# Patient Record
Sex: Female | Born: 1990 | Race: Black or African American | Hispanic: No | Marital: Single | Smoking: Current some day smoker
Health system: Southern US, Community
[De-identification: ages and names within clinical notes are randomized; demographics above are authoritative.]

---
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
## 2011-01-27 ENCOUNTER — Emergency Department (HOSPITAL_COMMUNITY)
Admission: EM | Admit: 2011-01-27 | Discharge: 2011-01-27 | Disposition: A | Discharge status: Home / Self Care | Payer: Self-pay | Attending: Emergency Medicine | Admitting: Emergency Medicine

## 2011-01-27 DIAGNOSIS — H9209 Otalgia, unspecified ear: Secondary | ICD-10-CM | POA: Insufficient documentation

## 2011-01-27 DIAGNOSIS — H6690 Otitis media, unspecified, unspecified ear: Secondary | ICD-10-CM | POA: Insufficient documentation

## 2011-02-03 ENCOUNTER — Inpatient Hospital Stay (INDEPENDENT_AMBULATORY_CARE_PROVIDER_SITE_OTHER)
Admission: RE | Admit: 2011-02-03 | Discharge: 2011-02-03 | Disposition: A | Discharge status: Home / Self Care | Payer: 59 | Source: Ambulatory Visit | Attending: Family Medicine | Admitting: Family Medicine

## 2011-02-03 DIAGNOSIS — I10 Essential (primary) hypertension: Secondary | ICD-10-CM

## 2011-02-03 DIAGNOSIS — T50995A Adverse effect of other drugs, medicaments and biological substances, initial encounter: Secondary | ICD-10-CM

## 2011-02-03 DIAGNOSIS — H6690 Otitis media, unspecified, unspecified ear: Secondary | ICD-10-CM

## 2011-12-24 ENCOUNTER — Encounter (HOSPITAL_COMMUNITY): Payer: Self-pay | Admitting: *Deleted

## 2011-12-24 DIAGNOSIS — R110 Nausea: Secondary | ICD-10-CM | POA: Insufficient documentation

## 2011-12-24 DIAGNOSIS — R51 Headache: Secondary | ICD-10-CM | POA: Insufficient documentation

## 2011-12-24 DIAGNOSIS — Z880 Allergy status to penicillin: Secondary | ICD-10-CM | POA: Insufficient documentation

## 2011-12-24 DIAGNOSIS — N390 Urinary tract infection, site not specified: Secondary | ICD-10-CM | POA: Insufficient documentation

## 2011-12-24 NOTE — ED Notes (Signed)
The pt has had a headache all day  With nausea.  She has headaches around her lmp usually

## 2011-12-25 ENCOUNTER — Emergency Department (HOSPITAL_COMMUNITY)
Admission: EM | Admit: 2011-12-25 | Discharge: 2011-12-25 | Disposition: A | Discharge status: Home / Self Care | Payer: 59 | Attending: Emergency Medicine | Admitting: Emergency Medicine

## 2011-12-25 DIAGNOSIS — N390 Urinary tract infection, site not specified: Secondary | ICD-10-CM

## 2011-12-25 DIAGNOSIS — R51 Headache: Secondary | ICD-10-CM

## 2011-12-25 LAB — URINALYSIS, ROUTINE W REFLEX MICROSCOPIC
Bilirubin Urine: NEGATIVE
Glucose, UA: NEGATIVE mg/dL
Hgb urine dipstick: NEGATIVE
Ketones, ur: NEGATIVE mg/dL
Nitrite: NEGATIVE
Protein, ur: NEGATIVE mg/dL
Specific Gravity, Urine: 1.029 (ref 1.005–1.030)
pH: 6.5 (ref 5.0–8.0)

## 2011-12-25 LAB — URINE MICROSCOPIC-ADD ON

## 2011-12-25 MED ORDER — NITROFURANTOIN MONOHYD MACRO 100 MG PO CAPS: 100.0000 mg | ORAL_CAPSULE | Freq: Two times a day (BID) | ORAL | Status: AC

## 2011-12-25 MED ORDER — NITROFURANTOIN MONOHYD MACRO 100 MG PO CAPS
100.0000 mg | ORAL_CAPSULE | Freq: Once | ORAL | Status: AC
  Administered 2011-12-25: 100 mg via ORAL
  Filled 2011-12-25: qty 1

## 2011-12-25 MED ORDER — METOCLOPRAMIDE HCL 5 MG/ML IJ SOLN
10.0000 mg | Freq: Once | INTRAMUSCULAR | Status: AC
  Administered 2011-12-25: 10 mg via INTRAVENOUS
  Filled 2011-12-25: qty 2

## 2011-12-25 MED ORDER — KETOROLAC TROMETHAMINE 30 MG/ML IJ SOLN
30.0000 mg | Freq: Once | INTRAMUSCULAR | Status: AC
  Administered 2011-12-25: 30 mg via INTRAMUSCULAR
  Filled 2011-12-25: qty 1

## 2011-12-25 NOTE — ED Notes (Signed)
Patient is resting comfortably. 

## 2011-12-25 NOTE — ED Notes (Signed)
Pt ambulated with a steady gait; VSS; A&Ox3; no signs of distress; respirations even and unlabored; skin warm and dry. No questions  

## 2011-12-25 NOTE — ED Provider Notes (Signed)
History     CSN: [id number]  Arrival date & time 12/24/11  2326   First MD Initiated Contact with Patient 12/25/11 0114      Chief Complaint  Patient presents with  . Headache    (Consider location/radiation/quality/duration/timing/severity/associated sxs/prior treatment) HPI Comments: Pateint with frontal HA and nausea today took 1 baby asa without relief.   States she gets headache around the time of her menses which is due in 2 days   Patient is a 21 y.o. female presenting with headaches. The history is provided by the patient.  Headache  This is a new problem. The problem occurs constantly. The headache is associated with nothing. The pain is located in the frontal region. The pain is at a severity of 5/10. The pain is mild. The pain does not radiate. Associated symptoms include nausea. Pertinent negatives include no fever, no shortness of breath and no vomiting.    History reviewed. No pertinent past medical history.  History reviewed. No pertinent past surgical history.  No family history on file.  History  Substance Use Topics  . Smoking status: Never Smoker   . Smokeless tobacco: Not on file  . Alcohol Use: No    OB History    Grav Para Term Preterm Abortions TAB SAB Ect Mult Living                  Review of Systems  Constitutional: Negative for fever.  Respiratory: Negative for shortness of breath.   Gastrointestinal: Positive for nausea. Negative for vomiting.  Genitourinary: Negative for dysuria, frequency, vaginal bleeding and vaginal discharge.  Neurological: Positive for headaches. Negative for dizziness.    Allergies  Penicillins  Home Medications   Current Outpatient Rx  Name Route Sig Dispense Refill  . ASPIRIN 81 MG PO CHEW Oral Chew 486 mg by mouth daily as needed. For pain    . NITROFURANTOIN MONOHYD MACRO 100 MG PO CAPS Oral Take 1 capsule (100 mg total) by mouth 2 (two) times daily. 10 capsule 0    BP 112/74  Pulse 71  Temp 98.2 F  (36.8 C) (Oral)  Resp 18  SpO2 98%  LMP 11/27/2011  Physical Exam  Constitutional: She appears well-developed.  HENT:  Head: Normocephalic.  Eyes: Pupils are equal, round, and reactive to light.  Neck: Normal range of motion.  Cardiovascular: Normal rate.   Pulmonary/Chest: Effort normal.  Abdominal: Soft. There is no tenderness.  Musculoskeletal: Normal range of motion.  Neurological: She is alert.  Skin: Skin is warm.    ED Course  Procedures (including critical care time)  Labs Reviewed  URINALYSIS, ROUTINE W REFLEX MICROSCOPIC - Abnormal; Notable for the following:    APPearance CLOUDY (*)     Leukocytes, UA SMALL (*)     All other components within normal limits  URINE MICROSCOPIC-ADD ON - Abnormal; Notable for the following:    Squamous Epithelial / LPF MANY (*)     Bacteria, UA MANY (*)     All other components within normal limits  URINE CULTURE   No results found. Pregnancy test negative  1. Headache   2. UTI (lower urinary tract infection)       MDM  Frontal headache and nausea today   Will check UA as this headache is associated with nausea that is unusual for her  UA positive will treat with macrobid for 5 days         Arman Filter, NP 12/25/11 0254  Cipriano Mile  Manus Rudd, NP 12/25/11 1610

## 2011-12-25 NOTE — ED Provider Notes (Signed)
Medical screening examination/treatment/procedure(s) were performed by non-physician practitioner and as supervising physician I was immediately available for consultation/collaboration.   Avaree Gilberti, MD 12/25/11 0600 

## 2011-12-26 LAB — URINE CULTURE

## 2011-12-27 LAB — POCT PREGNANCY, URINE: Preg Test, Ur: NEGATIVE

## 2013-03-12 ENCOUNTER — Encounter (HOSPITAL_COMMUNITY): Payer: Self-pay | Admitting: Emergency Medicine

## 2013-03-12 ENCOUNTER — Emergency Department (INDEPENDENT_AMBULATORY_CARE_PROVIDER_SITE_OTHER)
Admission: EM | Admit: 2013-03-12 | Discharge: 2013-03-12 | Disposition: A | Discharge status: Home / Self Care | Payer: Self-pay | Source: Home / Self Care | Attending: Family Medicine | Admitting: Family Medicine

## 2013-03-12 DIAGNOSIS — J029 Acute pharyngitis, unspecified: Secondary | ICD-10-CM

## 2013-03-12 LAB — POCT RAPID STREP A: Streptococcus, Group A Screen (Direct): NEGATIVE

## 2013-03-12 MED ORDER — AMOXICILLIN 500 MG PO CAPS: 500.0000 mg | ORAL_CAPSULE | Freq: Three times a day (TID) | ORAL | Status: DC

## 2013-03-12 NOTE — ED Provider Notes (Signed)
Sabrina Berry is a 22 y.o. female who presents to Urgent Care today for sore throat and mild subjective fever present since yesterday. Patient has not tried any medications. She notes some positive sick contacts at home. She denies any cough congestion runny nose nausea vomiting or diarrhea. She denies any trouble breathing. She feels well otherwise. She is allergic to penicillin but can take amoxicillin.   History reviewed. No pertinent past medical history. History  Substance Use Topics  . Smoking status: Never Smoker   . Smokeless tobacco: Not on file  . Alcohol Use: No   ROS as above Medications reviewed. No current facility-administered medications for this encounter.   Current Outpatient Prescriptions  Medication Sig Dispense Refill  . amoxicillin (AMOXIL) 500 MG capsule Take 1 capsule (500 mg total) by mouth 3 (three) times daily.  30 capsule  0  . aspirin 81 MG chewable tablet Chew 486 mg by mouth daily as needed. For pain        Exam:  BP 120/87  Pulse 78  Temp(Src) 98.8 F (37.1 C) (Oral)  Resp 18  SpO2 98%  LMP 02/10/2013 Gen: Well NAD HEENT: EOMI,  MMM, posterior pharynx is erythematous with exudate. Tympanic membranes are normal appearing bilaterally Lungs: CTABL Nl WOB Heart: RRR no MRG Abd: NABS, NT, ND Exts: Non edematous BL  LE, warm and well perfused.   Results for orders placed during the hospital encounter of 03/12/13 (from the past 24 hour(s))  POCT RAPID STREP A ([facility] ONLY)     Status: None   Collection Time    03/12/13 10:20 AM      Result Value Range   Streptococcus, Group A Screen (Direct) NEGATIVE  NEGATIVE   No results found.  Assessment and Plan: 22 y.o. female with pharyngitis very likely streptococcal. Point-of-care test negative culture pending. Plan to treat empirically with amoxicillin. Patient states she can tolerate this medication. Loss use over-the-counter pain medications as needed for symptom relief. Discussed warning  signs or symptoms. Please see discharge instructions. Patient expresses understanding.      Rodolph Bong, MD 03/12/13 [phone]

## 2013-03-12 NOTE — ED Notes (Signed)
C/o sore throat. Onset yesterday . Pt has felt feverish and having pain with swallowing. Denies any other symptoms.  otc meds taken with no relief.

## 2013-03-14 LAB — CULTURE, GROUP A STREP

## 2013-03-15 ENCOUNTER — Telehealth (HOSPITAL_COMMUNITY): Payer: Self-pay | Admitting: *Deleted

## 2013-03-15 NOTE — ED Notes (Signed)
Throat culture: Strep beta hemolytic not group A.  Pt. adequately treated with Amoxicillin.  I called pt. and VM not set up. Vassie Moselle 03/15/2013

## 2013-03-16 ENCOUNTER — Telehealth (HOSPITAL_COMMUNITY): Payer: Self-pay | Admitting: *Deleted

## 2013-03-16 NOTE — ED Notes (Signed)
Voicemail not set up.  I called contact-mother and left a message for pt. to call. Sabrina Berry 03/16/2013

## 2013-03-18 ENCOUNTER — Telehealth (HOSPITAL_COMMUNITY): Payer: Self-pay | Admitting: *Deleted

## 2013-03-18 NOTE — ED Notes (Signed)
I called pt. Pt. verified x 2 and given result.  Pt. told she was adequately treated with Amoxicillin for strep. Notify contacts if they get the same symptoms to get checked. Pt. states she is getting better. Sabrina Berry 03/18/2013

## 2013-06-18 ENCOUNTER — Emergency Department (HOSPITAL_COMMUNITY)
Admission: EM | Admit: 2013-06-18 | Discharge: 2013-06-18 | Discharge status: Against Medical Advice | Payer: Self-pay | Attending: Emergency Medicine | Admitting: Emergency Medicine

## 2013-06-18 ENCOUNTER — Encounter (HOSPITAL_COMMUNITY): Payer: Self-pay | Admitting: Emergency Medicine

## 2013-06-18 DIAGNOSIS — R079 Chest pain, unspecified: Secondary | ICD-10-CM | POA: Insufficient documentation

## 2013-06-18 DIAGNOSIS — R110 Nausea: Secondary | ICD-10-CM | POA: Insufficient documentation

## 2013-06-18 NOTE — ED Notes (Signed)
Pt. reports mid chest pain onset last night with nausea , denies SOB / cough or congestion .

## 2013-10-27 ENCOUNTER — Emergency Department (INDEPENDENT_AMBULATORY_CARE_PROVIDER_SITE_OTHER)
Admission: EM | Admit: 2013-10-27 | Discharge: 2013-10-27 | Disposition: A | Discharge status: Home / Self Care | Payer: Self-pay | Source: Home / Self Care | Attending: Family Medicine | Admitting: Family Medicine

## 2013-10-27 DIAGNOSIS — K529 Noninfective gastroenteritis and colitis, unspecified: Secondary | ICD-10-CM

## 2013-10-27 DIAGNOSIS — K5289 Other specified noninfective gastroenteritis and colitis: Secondary | ICD-10-CM

## 2013-10-27 LAB — POCT I-STAT, CHEM 8
BUN: 15 mg/dL (ref 6–23)
CALCIUM ION: 1.22 mmol/L (ref 1.12–1.23)
CREATININE: 0.9 mg/dL (ref 0.50–1.10)
Chloride: 106 mEq/L (ref 96–112)
Glucose, Bld: 125 mg/dL — ABNORMAL HIGH (ref 70–99)
HCT: 45 % (ref 36.0–46.0)
HEMOGLOBIN: 15.3 g/dL — AB (ref 12.0–15.0)
Potassium: 3.5 mEq/L — ABNORMAL LOW (ref 3.7–5.3)
SODIUM: 141 meq/L (ref 137–147)
TCO2: 19 mmol/L (ref 0–100)

## 2013-10-27 MED ORDER — ONDANSETRON HCL 4 MG PO TABS: 4.0000 mg | ORAL_TABLET | Freq: Four times a day (QID) | ORAL | Status: DC

## 2013-10-27 MED ORDER — ONDANSETRON HCL 4 MG/2ML IJ SOLN
4.0000 mg | Freq: Once | INTRAMUSCULAR | Status: AC
  Administered 2013-10-27: 4 mg via INTRAVENOUS

## 2013-10-27 MED ORDER — SODIUM CHLORIDE 0.9 % IV SOLN
Freq: Once | INTRAVENOUS | Status: AC
  Administered 2013-10-27: 16:00:00 via INTRAVENOUS

## 2013-10-27 MED ORDER — ONDANSETRON HCL 4 MG/2ML IJ SOLN
INTRAMUSCULAR | Status: AC
  Filled 2013-10-27: qty 2

## 2013-10-27 NOTE — ED Notes (Signed)
20 gauge iv inserted via left AC without diff 4 mg iv given via line Pt is resting comfortable. Will cot to monitor

## 2013-10-27 NOTE — ED Provider Notes (Signed)
CSN: [id number]     Arrival date & time 10/27/13  1328 History   None    Chief Complaint  Patient presents with  . GI Problem  . Dizziness   (Consider location/radiation/quality/duration/timing/severity/associated sxs/prior Treatment) Patient is a 23 y.o. female presenting with GI illness and dizziness. The history is provided by the patient.  GI Problem This is a new problem. The current episode started 12 to 24 hours ago. The problem has been gradually improving. Pertinent negatives include no chest pain and no abdominal pain.  Dizziness Associated symptoms: diarrhea, nausea and vomiting   Associated symptoms: no blood in stool and no chest pain     No past medical history on file. No past surgical history on file. No family history on file. History  Substance Use Topics  . Smoking status: Never Smoker   . Smokeless tobacco: Not on file  . Alcohol Use: No   OB History   Grav Para Term Preterm Abortions TAB SAB Ect Mult Living                 Review of Systems  Constitutional: Negative.   Cardiovascular: Negative for chest pain.  Gastrointestinal: Positive for nausea, vomiting and diarrhea. Negative for abdominal pain and blood in stool.  Genitourinary: Negative.   Neurological: Positive for dizziness.    Allergies  Penicillins  Home Medications   Prior to Admission medications   Medication Sig Start Date End Date Taking? Authorizing Provider  amoxicillin (AMOXIL) 500 MG capsule Take 1 capsule (500 mg total) by mouth 3 (three) times daily. 03/12/13   Rodolph Bong, MD  aspirin 81 MG chewable tablet Chew 486 mg by mouth daily as needed. For pain    Historical Provider, MD   BP 99/69  Pulse 117 Physical Exam  Nursing note and vitals reviewed. Constitutional: She is oriented to person, place, and time. She appears well-developed and well-nourished.  HENT:  Head: Normocephalic.  Right Ear: External ear normal.  Left Ear: External ear normal.  Mouth/Throat:  Oropharynx is clear and moist.  Eyes: Conjunctivae and EOM are normal. Pupils are equal, round, and reactive to light.  Neck: Normal range of motion. Neck supple.  Cardiovascular: Normal heart sounds.   Pulmonary/Chest: Breath sounds normal.  Abdominal: Soft. Bowel sounds are normal. She exhibits no distension and no mass. There is no tenderness. There is no rebound and no guarding.  Lymphadenopathy:    She has no cervical adenopathy.  Neurological: She is alert and oriented to person, place, and time.  Skin: Skin is warm and dry.    ED Course  Procedures (including critical care time) Labs Review Labs Reviewed  POCT I-STAT, CHEM 8 - Abnormal; Notable for the following:    Potassium 3.5 (*)    Glucose, Bld 125 (*)    Hemoglobin 15.3 (*)    All other components within normal limits    Imaging Review No results found.   MDM   1. Gastroenteritis, acute    Sx improved after ivf and meds.    Linna Hoff, MD 10/27/13 [phone]

## 2013-10-27 NOTE — ED Notes (Signed)
Pt comes in with sudden nv/d. States she ate pizza last night but no new foods Orthostats positive  Denies abdominal pain at this time

## 2013-10-27 NOTE — Discharge Instructions (Signed)
Clear liquid , bland diet tonight as tolerated, advance on sun as improved, use medicine as needed for vomiting and imodium for diarrhea. return or see your doctor if any problems.

## 2014-08-02 ENCOUNTER — Emergency Department (INDEPENDENT_AMBULATORY_CARE_PROVIDER_SITE_OTHER)
Admission: EM | Admit: 2014-08-02 | Discharge: 2014-08-02 | Disposition: A | Discharge status: Home / Self Care | Payer: Self-pay | Source: Home / Self Care | Attending: Emergency Medicine | Admitting: Emergency Medicine

## 2014-08-02 ENCOUNTER — Other Ambulatory Visit (HOSPITAL_COMMUNITY)
Admission: RE | Admit: 2014-08-02 | Discharge: 2014-08-02 | Disposition: A | Discharge status: Home / Self Care | Payer: Self-pay | Source: Ambulatory Visit | Attending: Emergency Medicine | Admitting: Emergency Medicine

## 2014-08-02 ENCOUNTER — Encounter (HOSPITAL_COMMUNITY): Payer: Self-pay | Admitting: Emergency Medicine

## 2014-08-02 DIAGNOSIS — N760 Acute vaginitis: Secondary | ICD-10-CM | POA: Insufficient documentation

## 2014-08-02 DIAGNOSIS — A499 Bacterial infection, unspecified: Secondary | ICD-10-CM

## 2014-08-02 DIAGNOSIS — B9689 Other specified bacterial agents as the cause of diseases classified elsewhere: Secondary | ICD-10-CM

## 2014-08-02 DIAGNOSIS — Z113 Encounter for screening for infections with a predominantly sexual mode of transmission: Secondary | ICD-10-CM | POA: Insufficient documentation

## 2014-08-02 LAB — POCT URINALYSIS DIP (DEVICE)
Bilirubin Urine: NEGATIVE
GLUCOSE, UA: NEGATIVE mg/dL
HGB URINE DIPSTICK: NEGATIVE
Ketones, ur: NEGATIVE mg/dL
Leukocytes, UA: NEGATIVE
NITRITE: NEGATIVE
PROTEIN: NEGATIVE mg/dL
UROBILINOGEN UA: 0.2 mg/dL (ref 0.0–1.0)
pH: 6 (ref 5.0–8.0)

## 2014-08-02 LAB — POCT PREGNANCY, URINE: PREG TEST UR: NEGATIVE

## 2014-08-02 MED ORDER — FLUCONAZOLE 150 MG PO TABS: 150.0000 mg | ORAL_TABLET | Freq: Once | ORAL | Status: DC

## 2014-08-02 MED ORDER — METRONIDAZOLE 500 MG PO TABS: 500.0000 mg | ORAL_TABLET | Freq: Two times a day (BID) | ORAL | Status: DC

## 2014-08-02 NOTE — Discharge Instructions (Signed)
You have bacterial vaginosis and also a yeast infection. Take Flagyl one pill twice a day for one week. You have finished the Flagyl, take the Diflucan pill. I have sent your urine for culture. If you need additional antibiotics we will call you. Follow-up as needed.

## 2014-08-02 NOTE — ED Notes (Signed)
Pt is here today for a possible UTI, pt said that she has had urinary frequency and pain with urination for about 1 week, pt said that she has also had vaginal odor and discharge for the last few days

## 2014-08-02 NOTE — ED Provider Notes (Signed)
CSN: [id number]     Arrival date & time 08/02/14  [id number] History   First MD Initiated Contact with Patient 08/02/14 1027     Chief Complaint  Patient presents with  . Urinary Tract Infection   (Consider location/radiation/quality/duration/timing/severity/associated sxs/prior Treatment) HPI She is a 24 year old woman here for evaluation of dysuria and vaginal discharge. She states for about the last week she has had dysuria and frequency. She also reports some mild abdominal pain first thing in the morning. This will resolve within 30 minutes. She states the dysuria has improved. Over the last 3 days she has noted a vaginal discharge with odor. She denies itching. No new sexual partners. No fevers or chills. No pelvic pain. She would like STD screening today.  History reviewed. No pertinent past medical history. History reviewed. No pertinent past surgical history. History reviewed. No pertinent family history. History  Substance Use Topics  . Smoking status: Never Smoker   . Smokeless tobacco: Not on file  . Alcohol Use: No   OB History    No data available     Review of Systems  Constitutional: Negative for fever and chills.  Gastrointestinal: Positive for abdominal pain. Negative for nausea and vomiting.  Genitourinary: Positive for dysuria, frequency and vaginal discharge. Negative for hematuria and flank pain.    Allergies  Penicillins  Home Medications   Prior to Admission medications   Medication Sig Start Date End Date Taking? Authorizing Provider  levonorgestrel-ethinyl estradiol (AVIANE,ALESSE,LESSINA) 0.1-20 MG-MCG tablet Take 1 tablet by mouth daily.   Yes Historical Provider, MD  amoxicillin (AMOXIL) 500 MG capsule Take 1 capsule (500 mg total) by mouth 3 (three) times daily. 03/12/13   Rodolph BongEvan S Corey, MD  aspirin 81 MG chewable tablet Chew 486 mg by mouth daily as needed. For pain    Historical Provider, MD  fluconazole (DIFLUCAN) 150 MG tablet Take 1 tablet (150 mg  total) by mouth once. 08/02/14   Charm RingsErin J Kimber Fritts, MD  metroNIDAZOLE (FLAGYL) 500 MG tablet Take 1 tablet (500 mg total) by mouth 2 (two) times daily. 08/02/14   Charm RingsErin J Hoover Grewe, MD  ondansetron (ZOFRAN) 4 MG tablet Take 1 tablet (4 mg total) by mouth every 6 (six) hours. Prn n/v. 10/27/13   Linna HoffJames D Kindl, MD   BP 116/76 mmHg  Pulse 69  Temp(Src) 98.4 F (36.9 C) (Oral)  Resp 16  SpO2 99%  LMP 07/14/2014 Physical Exam  Constitutional: She is oriented to person, place, and time. She appears well-developed and well-nourished. No distress.  Neck: Neck supple.  Cardiovascular: Normal rate.   Pulmonary/Chest: Effort normal.  Abdominal: Soft.  Genitourinary: There is no rash on the right labia. There is no rash on the left labia. Cervix exhibits no discharge. Vaginal discharge (white, odor) found.  Neurological: She is alert and oriented to person, place, and time.    ED Course  Procedures (including critical care time) Labs Review Labs Reviewed  URINE CULTURE  HIV ANTIBODY (ROUTINE TESTING)  RPR  POCT URINALYSIS DIP (DEVICE)  POCT PREGNANCY, URINE  CERVICOVAGINAL ANCILLARY ONLY    Imaging Review No results found.   MDM   1. BV (bacterial vaginosis)    UA is clean.  Urine sent for culture. Will treat with Flagyl. Diflucan after flagyl. STD screening labs collected. Follow up as needed.    Charm RingsErin J Sven Pinheiro, MD 08/02/14 [phone]

## 2014-08-03 LAB — HIV ANTIBODY (ROUTINE TESTING W REFLEX): HIV Screen 4th Generation wRfx: NONREACTIVE

## 2014-08-03 LAB — URINE CULTURE

## 2014-08-03 LAB — RPR: RPR Ser Ql: NONREACTIVE

## 2014-08-05 LAB — CERVICOVAGINAL ANCILLARY ONLY
CHLAMYDIA, DNA PROBE: NEGATIVE
NEISSERIA GONORRHEA: NEGATIVE
WET PREP (BD AFFIRM): NEGATIVE
Wet Prep (BD Affirm): NEGATIVE
Wet Prep (BD Affirm): POSITIVE — AB

## 2014-08-07 NOTE — ED Notes (Signed)
GC/Chlamydia neg., Affirm: Candida and Trich neg., Gardnerella pos.  Pt. adequately treated with Flagyl. Sabrina Berry, Sabrina Berry 08/07/2014

## 2014-09-27 ENCOUNTER — Emergency Department (HOSPITAL_COMMUNITY): Payer: Self-pay

## 2014-09-27 ENCOUNTER — Encounter (HOSPITAL_COMMUNITY): Payer: Self-pay | Admitting: Oncology

## 2014-09-27 ENCOUNTER — Emergency Department (HOSPITAL_COMMUNITY)
Admission: EM | Admit: 2014-09-27 | Discharge: 2014-09-27 | Disposition: A | Discharge status: Home / Self Care | Payer: Self-pay | Attending: Emergency Medicine | Admitting: Emergency Medicine

## 2014-09-27 DIAGNOSIS — Y92410 Unspecified street and highway as the place of occurrence of the external cause: Secondary | ICD-10-CM | POA: Insufficient documentation

## 2014-09-27 DIAGNOSIS — Z880 Allergy status to penicillin: Secondary | ICD-10-CM | POA: Insufficient documentation

## 2014-09-27 DIAGNOSIS — S51811A Laceration without foreign body of right forearm, initial encounter: Secondary | ICD-10-CM | POA: Insufficient documentation

## 2014-09-27 DIAGNOSIS — S41111A Laceration without foreign body of right upper arm, initial encounter: Secondary | ICD-10-CM

## 2014-09-27 DIAGNOSIS — Z793 Long term (current) use of hormonal contraceptives: Secondary | ICD-10-CM | POA: Insufficient documentation

## 2014-09-27 DIAGNOSIS — Y998 Other external cause status: Secondary | ICD-10-CM | POA: Insufficient documentation

## 2014-09-27 DIAGNOSIS — Z79899 Other long term (current) drug therapy: Secondary | ICD-10-CM | POA: Insufficient documentation

## 2014-09-27 DIAGNOSIS — Z7982 Long term (current) use of aspirin: Secondary | ICD-10-CM | POA: Insufficient documentation

## 2014-09-27 DIAGNOSIS — Y9389 Activity, other specified: Secondary | ICD-10-CM | POA: Insufficient documentation

## 2014-09-27 MED ORDER — OXYCODONE-ACETAMINOPHEN 5-325 MG PO TABS: 1.0000 | ORAL_TABLET | ORAL | Status: DC | PRN

## 2014-09-27 MED ORDER — OXYCODONE-ACETAMINOPHEN 5-325 MG PO TABS
1.0000 | ORAL_TABLET | Freq: Once | ORAL | Status: AC
  Administered 2014-09-27: 1 via ORAL
  Filled 2014-09-27: qty 1

## 2014-09-27 MED ORDER — LIDOCAINE-EPINEPHRINE 2 %-1:100000 IJ SOLN
20.0000 mL | Freq: Once | INTRAMUSCULAR | Status: AC
  Administered 2014-09-27: 20 mL via INTRADERMAL
  Filled 2014-09-27: qty 1
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
## 2014-09-27 NOTE — Discharge Instructions (Signed)
Take the prescribed medication as directed.  May also take motrin if needed. Follow-up with urgent care in 1 week for suture removal.  Keep sutures clean and dry until then. Return to the ED for new or worsening symptoms.

## 2014-10-07 ENCOUNTER — Encounter (HOSPITAL_COMMUNITY): Payer: Self-pay

## 2014-10-07 ENCOUNTER — Emergency Department (INDEPENDENT_AMBULATORY_CARE_PROVIDER_SITE_OTHER)
Admission: EM | Admit: 2014-10-07 | Discharge: 2014-10-07 | Disposition: A | Discharge status: Home / Self Care | Payer: Self-pay | Source: Home / Self Care | Attending: Family Medicine | Admitting: Family Medicine

## 2014-10-07 DIAGNOSIS — Z4802 Encounter for removal of sutures: Secondary | ICD-10-CM
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
## 2014-10-07 NOTE — Discharge Instructions (Signed)

## 2015-08-01 ENCOUNTER — Encounter (HOSPITAL_COMMUNITY): Payer: Self-pay | Admitting: Emergency Medicine

## 2015-08-01 ENCOUNTER — Emergency Department (HOSPITAL_COMMUNITY): Payer: Self-pay

## 2015-08-01 ENCOUNTER — Emergency Department (HOSPITAL_COMMUNITY)
Admission: EM | Admit: 2015-08-01 | Discharge: 2015-08-01 | Disposition: A | Discharge status: Home / Self Care | Payer: Self-pay | Attending: Emergency Medicine | Admitting: Emergency Medicine

## 2015-08-01 DIAGNOSIS — M436 Torticollis: Secondary | ICD-10-CM | POA: Insufficient documentation

## 2015-08-01 DIAGNOSIS — Y998 Other external cause status: Secondary | ICD-10-CM | POA: Insufficient documentation

## 2015-08-01 DIAGNOSIS — S0990XA Unspecified injury of head, initial encounter: Secondary | ICD-10-CM | POA: Insufficient documentation

## 2015-08-01 DIAGNOSIS — S4991XA Unspecified injury of right shoulder and upper arm, initial encounter: Secondary | ICD-10-CM | POA: Insufficient documentation

## 2015-08-01 DIAGNOSIS — Y9289 Other specified places as the place of occurrence of the external cause: Secondary | ICD-10-CM | POA: Insufficient documentation

## 2015-08-01 DIAGNOSIS — Z793 Long term (current) use of hormonal contraceptives: Secondary | ICD-10-CM | POA: Insufficient documentation

## 2015-08-01 DIAGNOSIS — Z880 Allergy status to penicillin: Secondary | ICD-10-CM | POA: Insufficient documentation

## 2015-08-01 DIAGNOSIS — M542 Cervicalgia: Secondary | ICD-10-CM

## 2015-08-01 DIAGNOSIS — X58XXXA Exposure to other specified factors, initial encounter: Secondary | ICD-10-CM | POA: Insufficient documentation

## 2015-08-01 DIAGNOSIS — Y9389 Activity, other specified: Secondary | ICD-10-CM | POA: Insufficient documentation

## 2015-08-01 MED ORDER — DIAZEPAM 5 MG PO TABS: 5.0000 mg | ORAL_TABLET | Freq: Four times a day (QID) | ORAL | Status: DC | PRN

## 2015-08-01 MED ORDER — ACETAMINOPHEN 325 MG PO TABS
650.0000 mg | ORAL_TABLET | Freq: Once | ORAL | Status: AC
  Administered 2015-08-01: 650 mg via ORAL
  Filled 2015-08-01: qty 2

## 2015-08-01 MED ORDER — ACETAMINOPHEN 500 MG PO TABS: 500.0000 mg | ORAL_TABLET | Freq: Four times a day (QID) | ORAL | Status: AC | PRN

## 2015-08-01 MED ORDER — IBUPROFEN 400 MG PO TABS
800.0000 mg | ORAL_TABLET | Freq: Once | ORAL | Status: AC
  Administered 2015-08-01: 800 mg via ORAL
  Filled 2015-08-01: qty 2

## 2015-08-01 MED ORDER — IBUPROFEN 800 MG PO TABS: 800.0000 mg | ORAL_TABLET | Freq: Three times a day (TID) | ORAL | Status: DC

## 2015-08-01 NOTE — ED Provider Notes (Signed)
CSN: [id number]     Arrival date & time 08/01/15  1102 History  By signing my name below, I, Essence Howell, attest that this documentation has been prepared under the direction and in the presence of Cheri Fowler, PA-C Electronically Signed: Charline Bills, ED Scribe 08/01/2015 at 12:35 PM.   Chief Complaint  Patient presents with  . Neck Pain   The history is provided by the patient. No language interpreter was used.   HPI Comments: Sabrina Berry is a 25 y.o. female who presents to the Emergency Department complaining of sudden onset of constant right-sided neck pain onset this morning. Pt states that she was putting a scarf on her head this morning when she felt a "pop" in her neck followed by sudden, shooting neck pain. Pt reports gradually improving, 8/10 neck pain that is exacerbated with turning her neck and associated occipital HA only with moving her head towards her chin. She denies fever, numbness/tingling in upper extremities, visual disturbance, N/V. No medications tried PTA.  No injury/trauma.   History reviewed. No pertinent past medical history. History reviewed. No pertinent past surgical history. History reviewed. No pertinent family history. Social History  Substance Use Topics  . Smoking status: Never Smoker   . Smokeless tobacco: None  . Alcohol Use: Yes   OB History    No data available     Review of Systems  Musculoskeletal: Positive for neck pain.  Neurological: Positive for headaches.  All other systems reviewed and are negative.  Allergies  Penicillins  Home Medications   Prior to Admission medications   Medication Sig Start Date End Date Taking? Authorizing Provider  acetaminophen (TYLENOL) 500 MG tablet Take 1 tablet (500 mg total) by mouth every 6 (six) hours as needed. 08/01/15   Cheri Fowler, PA-C  diazepam (VALIUM) 5 MG tablet Take 1 tablet (5 mg total) by mouth every 6 (six) hours as needed for muscle spasms. 08/01/15   Cheri Fowler, PA-C  ibuprofen  (ADVIL,MOTRIN) 800 MG tablet Take 1 tablet (800 mg total) by mouth 3 (three) times daily. 08/01/15   Cheri Fowler, PA-C  levonorgestrel-ethinyl estradiol (AVIANE,ALESSE,LESSINA) 0.1-20 MG-MCG tablet Take 1 tablet by mouth daily.    Historical Provider, MD   BP 118/78 mmHg  Pulse 78  Temp(Src) 98.6 F (37 C) (Oral)  Resp 18  SpO2 100% Physical Exam  Constitutional: She is oriented to person, place, and time. She appears well-developed and well-nourished.  Non-toxic appearance. She does not have a sickly appearance. She does not appear ill.  HENT:  Head: Normocephalic and atraumatic.  Right Ear: External ear normal.  Left Ear: External ear normal.  Mouth/Throat: Oropharynx is clear and moist.  Eyes: Conjunctivae are normal. Pupils are equal, round, and reactive to light. No scleral icterus.  Neck: Normal range of motion. Neck supple. No tracheal deviation present.  Patient seated with head crooked to the left.  Decreased ROM towards the right and left (R>L).  Right trapezius TTP with spasm.  No erythema, ecchymosis, or warmth.   Cardiovascular: Normal rate, regular rhythm and normal heart sounds.   No murmur heard. Pulses:      Radial pulses are 2+ on the right side, and 2+ on the left side.  Pulmonary/Chest: Effort normal and breath sounds normal. No accessory muscle usage or stridor. No respiratory distress. She has no wheezes. She has no rhonchi. She has no rales.  Abdominal: Soft. Bowel sounds are normal. She exhibits no distension. There is no tenderness.  Musculoskeletal:  Normal range of motion. She exhibits tenderness.  Lymphadenopathy:    She has no cervical adenopathy.  Neurological: She is alert and oriented to person, place, and time.  Speech clear without dysarthria.  Cranial nerves grossly intact.  Strength and sensation intact bilaterally throughout upper and lower extremities.  No pronator drift.  RAMs intact bilaterally.   Skin: Skin is warm and dry.  Psychiatric: She has a  normal mood and affect. Her behavior is normal.   ED Course  Procedures (including critical care time) DIAGNOSTIC STUDIES: Oxygen Saturation is 100% on RA, normal by my interpretation.    COORDINATION OF CARE: 11:40 AM-Discussed treatment plan which includes Tylenol, ibuprofen and XR with pt at bedside and pt agreed to plan.   Labs Review Labs Reviewed - No data to display  Imaging Review Dg Cervical Spine Complete  08/01/2015  CLINICAL DATA:  Pt was putting on her head scarf this morning and heard her neck crack. She now has all over neck pain. She was leaning her head to the left at the time, and now is it painful to straighten out her neck. No hx of injury or surgery EXAM: CERVICAL SPINE - COMPLETE 4+ VIEW COMPARISON:  None. FINDINGS: There is no evidence of cervical spine fracture or prevertebral soft tissue swelling. Alignment is normal. No other significant bone abnormalities are identified. IMPRESSION: Negative cervical spine radiographs. Electronically Signed   By: Corlis Leak M.D.   On: 08/01/2015 12:32   I have personally reviewed and evaluated these images and lab results as part of my medical decision-making.   EKG Interpretation None      MDM   Final diagnoses:  Neck pain  Torticollis    Patient presents with neck pain likely secondary to muscle spasm.  VSS, NAD.  No neurological deficits.  Doubt carotid artery dissection or SAH.  NVI.  Patient given ibuprofen and tylenol in ED.  Plain films negative.  Plan to discharge home with valium and ibuprofen. Follow up PCP.  Discussed return precautions.  Patient agrees and acknowledges the above plan for discharge.  I personally performed the services described in this documentation, which was scribed in my presence. The recorded information has been reviewed and is accurate.    Cheri Fowler, PA-C 08/01/15 1250  Pricilla Loveless, MD 08/02/15 1101

## 2015-08-01 NOTE — ED Notes (Signed)
Pt was putting on her scarf this am and had sudden neck pain with 'pop'. Pain right neck.

## 2015-08-01 NOTE — ED Notes (Signed)
Pt sts right sided neck pain after "neck popped" this am with movement; pt sts pain into head

## 2015-08-01 NOTE — Discharge Instructions (Signed)
Acute Torticollis °Torticollis is a condition in which the muscles of the neck tighten (contract) abnormally, causing the neck to twist and the head to move into an unnatural position. Torticollis that develops suddenly is called acute torticollis. If torticollis becomes chronic and is left untreated, the face and neck can become deformed. °CAUSES °This condition may be caused by: °· Sleeping in an awkward position (common). °· Extending or twisting the neck muscles beyond their normal position. °· Infection. °In some cases, the cause may not be known. °SYMPTOMS °Symptoms of this condition include: °· An unnatural position of the head. °· Neck pain. °· A limited ability to move the neck. °· Twisting of the neck to one side. °DIAGNOSIS °This condition is diagnosed with a physical exam. You may also have imaging tests, such as an X-ray, CT scan, or MRI. °TREATMENT °Treatment for this condition involves trying to relax the neck muscles. It may include: °· Medicines or shots. °· Physical therapy. °· Surgery. This may be done in severe cases. °HOME CARE INSTRUCTIONS °· Take medicines only as directed by your health care provider. °· Do stretching exercises and massage your neck as directed by your health care provider. °· Keep all follow-up visits as directed by your health care provider. This is important. °SEEK MEDICAL CARE IF: °· You develop a fever. °SEEK IMMEDIATE MEDICAL CARE IF: °· You develop difficulty breathing. °· You develop noisy breathing (stridor). °· You start drooling. °· You have trouble swallowing or have pain with swallowing. °· You develop numbness or weakness in your hands or feet. °· You have changes in your speech, understanding, or vision. °· Your pain gets worse. °  °This information is not intended to replace advice given to you by your health care provider. Make sure you discuss any questions you have with your health care provider. °  °Document Released: 05/21/2000 Document Revised:  10/08/2014 Document Reviewed: 05/20/2014 °Elsevier Interactive Patient Education ©2016 [employer] ° °

## 2018-03-23 ENCOUNTER — Encounter (HOSPITAL_COMMUNITY): Payer: Self-pay | Admitting: Emergency Medicine

## 2018-03-23 ENCOUNTER — Emergency Department (HOSPITAL_COMMUNITY)
Admission: EM | Admit: 2018-03-23 | Discharge: 2018-03-24 | Disposition: A | Discharge status: Home / Self Care | Payer: BLUE CROSS/BLUE SHIELD | Attending: Emergency Medicine | Admitting: Emergency Medicine

## 2018-03-23 ENCOUNTER — Other Ambulatory Visit: Payer: Self-pay

## 2018-03-23 DIAGNOSIS — Z79899 Other long term (current) drug therapy: Secondary | ICD-10-CM | POA: Diagnosis not present

## 2018-03-23 DIAGNOSIS — R102 Pelvic and perineal pain: Secondary | ICD-10-CM | POA: Diagnosis present

## 2018-03-23 LAB — URINALYSIS, ROUTINE W REFLEX MICROSCOPIC
BILIRUBIN URINE: NEGATIVE
Glucose, UA: NEGATIVE mg/dL
Hgb urine dipstick: NEGATIVE
Ketones, ur: NEGATIVE mg/dL
LEUKOCYTES UA: NEGATIVE
Nitrite: NEGATIVE
PH: 5 (ref 5.0–8.0)
Protein, ur: NEGATIVE mg/dL
SPECIFIC GRAVITY, URINE: 1.028 (ref 1.005–1.030)

## 2018-03-23 LAB — WET PREP, GENITAL
Clue Cells Wet Prep HPF POC: NONE SEEN
SPERM: NONE SEEN
Trich, Wet Prep: NONE SEEN
YEAST WET PREP: NONE SEEN

## 2018-03-23 LAB — PREGNANCY, URINE: PREG TEST UR: NEGATIVE

## 2018-03-23 NOTE — ED Provider Notes (Signed)
[facility] EMERGENCY DEPARTMENT Provider Note   CSN: [id number] Arrival date & time: 03/23/18  1924     History   Chief Complaint Chief Complaint  Patient presents with  . Pelvic Pain    HPI Sabrina Berry is a 27 y.o. female with no pertinent past medical history who presents to the emergency department with a chief complaint of pelvic pain.  The patient endorses intermittent pelvic pain x2 days.  She reports that she began to have some right-sided low back pain earlier today.  She characterizes the pain as feeling like a menstrual cramp, but more intense.  She reports associated nausea and white vaginal discharge that did not seem to have an odor.  No history of similar.  No known aggravating or alleviating factors.  No treatment prior to arrival.  She denies fever, chills, dysuria, urinary frequency, urgency, constipation, diarrhea, vomiting, vaginal itching, pain, or bleeding.  She reports that she was previously taking birth control, but is not actively using any form of contraceptives.  She is sexually active with one female partner.  She thinks that she might be pregnant.  LMP was 9/14-21/19, which was several days longer than her usual periods.  She states that she typically has regular menstrual cycles and is currently 4 days late.  The history is provided by the patient. No language interpreter was used.    History reviewed. No pertinent past medical history.  There are no active problems to display for this patient.   History reviewed. No pertinent surgical history.   OB History   None      Home Medications    Prior to Admission medications   Medication Sig Start Date End Date Taking? Authorizing Provider  acetaminophen (TYLENOL) 500 MG tablet Take 1 tablet (500 mg total) by mouth every 6 (six) hours as needed. 08/01/15   Cheri Fowler, PA-C  diazepam (VALIUM) 5 MG tablet Take 1 tablet (5 mg total) by mouth every 6 (six) hours as needed for  muscle spasms. 08/01/15   Cheri Fowler, PA-C  ibuprofen (ADVIL,MOTRIN) 600 MG tablet Take 1 tablet (600 mg total) by mouth every 6 (six) hours as needed. 03/24/18   McDonald, Mia A, PA-C  ondansetron (ZOFRAN ODT) 4 MG disintegrating tablet Take 1 tablet (4 mg total) by mouth every 8 (eight) hours as needed for nausea or vomiting. 03/24/18   McDonald, Mia A, PA-C    Family History No family history on file.  Social History Social History   Tobacco Use  . Smoking status: Never Smoker  . Smokeless tobacco: Never Used  Substance Use Topics  . Alcohol use: Yes    Comment: occ  . Drug use: No     Allergies   Penicillins   Review of Systems Review of Systems  Constitutional: Negative for activity change, chills and fever.  Respiratory: Negative for shortness of breath.   Cardiovascular: Negative for chest pain.  Gastrointestinal: Positive for nausea. Negative for abdominal pain and vomiting.  Genitourinary: Positive for pelvic pain and vaginal discharge. Negative for dysuria, flank pain, hematuria, urgency, vaginal bleeding and vaginal pain.  Musculoskeletal: Positive for back pain.  Skin: Negative for rash.  Allergic/Immunologic: Negative for immunocompromised state.  Neurological: Negative for headaches.  Psychiatric/Behavioral: Negative for confusion.     Physical Exam Updated Vital Signs BP 120/83   Pulse 87   Temp 98.7 F (37.1 C) (Oral)   Resp 16   Ht 5\' 2"  (1.575 m)   Wt 89.8  kg   LMP 02/25/2018   SpO2 97%   BMI 36.21 kg/m   Physical Exam  Constitutional: No distress.  HENT:  Head: Normocephalic.  Eyes: Conjunctivae are normal.  Neck: Neck supple.  Cardiovascular: Normal rate, regular rhythm, normal heart sounds and intact distal pulses. Exam reveals no gallop and no friction rub.  No murmur heard. Pulmonary/Chest: Effort normal and breath sounds normal. No stridor. No respiratory distress. She has no wheezes. She has no rales. She exhibits no tenderness.    Abdominal: Soft. She exhibits no distension and no mass. There is tenderness. There is no rebound and no guarding. No hernia.  Genitourinary: Uterus normal. There is no rash, tenderness, lesion or injury on the right labia. There is no rash, tenderness, lesion or injury on the left labia. Cervix exhibits no motion tenderness and no discharge. Right adnexum displays tenderness. Right adnexum displays no mass and no fullness. Left adnexum displays no mass, no tenderness and no fullness. Vaginal discharge found.  Genitourinary Comments: Chaperoned exam.  Thick, white vaginal discharge noted in the vaginal vault.  No odor is noted.  No friable cervix.  No cervical motion tenderness.  Musculoskeletal: She exhibits no tenderness.  Neurological: She is alert.  Skin: Skin is warm. No rash noted.  Psychiatric: Her behavior is normal.  Nursing note and vitals reviewed.  ED Treatments / Results  Labs (all labs ordered are listed, but only abnormal results are displayed) Labs Reviewed  WET PREP, GENITAL - Abnormal; Notable for the following components:      Result Value   WBC, Wet Prep HPF POC MODERATE (*)    All other components within normal limits  URINALYSIS, ROUTINE W REFLEX MICROSCOPIC  PREGNANCY, URINE  RPR  HIV ANTIBODY (ROUTINE TESTING W REFLEX)  GC/CHLAMYDIA PROBE AMP () NOT AT [facility]    EKG None  Radiology No results found.  Procedures Procedures (including critical care time)  Medications Ordered in ED Medications - No data to display   Initial Impression / Assessment and Plan / ED Course  I have reviewed the triage vital signs and the nursing notes.  Pertinent labs & imaging results that were available during my care of the patient were reviewed by me and considered in my medical decision making (see chart for details).     27 year old female with no pertinent past medical history presenting with pelvic pain for 2 days with right-sided low back pain that began  today.  She has associated nausea and white vaginal discharge.  She is 4 days late for her menstrual cycles, which are typically regular.  She is sexually active and is not currently using any form of contraceptive with her one female sexual partner.  Will order UA, urine pregnancy, wet prep, GC chlamydia.  Patient is also requesting RPR and HIV testing.  Urinalysis is unremarkable.  Wet prep with moderate WBCs, but otherwise unremarkable.  Pregnancy test is negative.  Gonorrhea, chlamydia, RPR, and HIV test are pending.  Discussed results and pending exams with the patient and her significant other.  I suspect the patient's symptoms may be due to anovulation given the change in her normally very consistent menstrual cycles.  Shared decision making conversation where we discussed getting a pelvic ultrasound to further assess for ovarian cyst.  I told her had low suspicion for ovarian torsion given her history and physical exam.  The patient preferred to have a referral to OB/GYN and follow-up in the clinic.  I was also agreeable to  this plan.  Given her exam, I have a low suspicion that her gonorrhea and chlamydia will be positive.  Will defer treatment at this time, which the patient is agreeable to.  Doubt ectopic pregnancy, diverticulitis, appendicitis, cholecystitis, PID, nephrolithiasis, or pyelonephritis.  Strict return precautions given.  She is hemodynamically stable and in no acute distress.  She is safe for discharge home with outpatient follow-up at this time.  Final Clinical Impressions(s) / ED Diagnoses   Final diagnoses:  Pelvic pain in female    ED Discharge Orders         Ordered    ondansetron (ZOFRAN ODT) 4 MG disintegrating tablet  Every 8 hours PRN     03/24/18 0038    ibuprofen (ADVIL,MOTRIN) 600 MG tablet  Every 6 hours PRN     03/24/18 0040           Barkley Boards, PA-C 03/24/18 0556    Tegeler, Canary Brim, MD 03/24/18 1112

## 2018-03-23 NOTE — ED Notes (Signed)
Urine changed to add on to previous collection.  POC not done in triage but sample sent to main lab.  Rec printed and sent

## 2018-03-23 NOTE — ED Triage Notes (Signed)
Pt reports intermittent 6/10 pelvic pain and cramping that started two days ago. Pt reports lower back, right flank pain that started today. Pt denies vaginal bleeding. Reports white vaginal discharge. Denies any urinary sx.

## 2018-03-24 LAB — HIV ANTIBODY (ROUTINE TESTING W REFLEX): HIV Screen 4th Generation wRfx: NONREACTIVE

## 2018-03-24 LAB — GC/CHLAMYDIA PROBE AMP (~~LOC~~) NOT AT ARMC
Chlamydia: NEGATIVE
Neisseria Gonorrhea: NEGATIVE

## 2018-03-24 LAB — RPR: RPR Ser Ql: NONREACTIVE

## 2018-03-24 MED ORDER — ONDANSETRON 4 MG PO TBDP: 4.0000 mg | ORAL_TABLET | Freq: Three times a day (TID) | ORAL | 0 refills | Status: DC | PRN

## 2018-03-24 MED ORDER — IBUPROFEN 600 MG PO TABS: 600.0000 mg | ORAL_TABLET | Freq: Four times a day (QID) | ORAL | 0 refills | Status: AC | PRN

## 2018-03-24 NOTE — Discharge Instructions (Signed)
Thank you for allowing me to care for you today in the Emergency Department.   Take 600 mg of ibuprofen with food every 6 hours to treat your pain.  For nausea, you can let 1 tablet of Zofran dissolve under your tongue every 8 hours as needed.  If your pain does not start to improve with this regimen in the next 5 to 7 days, please call and get established with an OB/GYN for follow-up.  I have listed 1 of the offices above.  Return to the emergency department if you develop new or worsening symptoms including persistent vomiting, severe vaginal bleeding with dizziness, lightheadedness, severe abdominal pain that does not improve with ibuprofen, high fever, or other new, concerning symptoms.

## 2020-08-12 ENCOUNTER — Ambulatory Visit (HOSPITAL_COMMUNITY)
Admission: EM | Admit: 2020-08-12 | Discharge: 2020-08-12 | Disposition: A | Discharge status: Home / Self Care | Payer: BLUE CROSS/BLUE SHIELD | Attending: Family Medicine | Admitting: Family Medicine

## 2020-08-12 ENCOUNTER — Encounter (HOSPITAL_COMMUNITY): Payer: Self-pay | Admitting: Emergency Medicine

## 2020-08-12 ENCOUNTER — Other Ambulatory Visit: Payer: Self-pay

## 2020-08-12 DIAGNOSIS — J029 Acute pharyngitis, unspecified: Secondary | ICD-10-CM | POA: Insufficient documentation

## 2020-08-12 LAB — POCT RAPID STREP A, ED / UC: Streptococcus, Group A Screen (Direct): NEGATIVE

## 2020-08-12 MED ORDER — AZITHROMYCIN 250 MG PO TABS: 250.0000 mg | ORAL_TABLET | Freq: Every day | ORAL | 0 refills | Status: DC

## 2020-08-12 NOTE — ED Triage Notes (Signed)
Patient c/o sore throat that started this morning.   Patient denies fever at home.   Patient endorses difficulty swallowing.   Patient endorses a headache upon onset of symptoms.   Patient denies nasal congestion, cough, or sneezing.   Patient took Excedrin for symptoms with relief of headache.

## 2020-08-12 NOTE — ED Provider Notes (Signed)
°  [facility] CARE CENTER   [id number] 08/12/20 Arrival Time: 1635  ASSESSMENT & PLAN:  1. Sore throat     No signs of peritonsillar abscess. Rapid strep negative. Given exam will tx empirically for strep.  Begin: Meds ordered this encounter  Medications   azithromycin (ZITHROMAX) 250 MG tablet    Sig: Take 1 tablet (250 mg total) by mouth daily. Take first 2 tablets together, then 1 every day until finished.    Dispense:  6 tablet    Refill:  0    Labs Reviewed  CULTURE, GROUP A STREP [facility])  POCT RAPID STREP A, ED / UC    OTC analgesics and throat care as needed  Instructed to finish full course of antibiotics. Will follow up if not showing significant improvement over the next 24-48 hours.    Discharge Instructions      You may use over the counter ibuprofen or acetaminophen as needed.   For a sore throat, over the counter products such as [employer] Peroxyl Mouth Sore Rinse or Chloraseptic Sore Throat Spray may provide some temporary relief.      Reviewed expectations re: course of current medical issues. Questions answered. Outlined signs and symptoms indicating need for more acute intervention. Patient verbalized understanding. After Visit Summary given.   SUBJECTIVE:  Sabrina Berry is a 30 y.o. female who reports a sore throat. Abrupt onset today. Sharp pain with swallowing. Decreased PO intake. No n/v. No resp symptoms. Unsure of temperature. Mild HA. Excedrin with some help.   OBJECTIVE:  Vitals:   08/12/20 1656  BP: 123/83  Pulse: 86  Resp: 16  Temp: 99 F (37.2 C)  TempSrc: Oral  SpO2: 98%     General appearance: alert; no distress HEENT: throat with moderate erythema and with exudative tonsillar hypertrophy; uvula is midline Neck: supple with FROM; small bilateral cerv LAD Lungs: speaks full sentences without difficulty; unlabored Abd: soft; non-tender Skin: reveals no rash; warm and dry Psychological: alert and cooperative; normal  mood and affect  Allergies  Allergen Reactions   Penicillins Swelling    History reviewed. No pertinent past medical history. Social History   Socioeconomic History   Marital status: Single    Spouse name: Not on file   Number of children: Not on file   Years of education: Not on file   Highest education level: Not on file  Occupational History   Not on file  Tobacco Use   Smoking status: Never Smoker   Smokeless tobacco: Never Used  Vaping Use   Vaping Use: Never used  Substance and Sexual Activity   Alcohol use: Yes    Comment: occ   Drug use: No   Sexual activity: Yes  Other Topics Concern   Not on file  Social History Narrative   Not on file   Social Determinants of Health   Financial Resource Strain: Not on file  Food Insecurity: Not on file  Transportation Needs: Not on file  Physical Activity: Not on file  Stress: Not on file  Social Connections: Not on file  Intimate Partner Violence: Not on file   History reviewed. No pertinent family history.        Mardella Layman, MD 08/12/20 1758

## 2020-08-12 NOTE — Discharge Instructions (Addendum)
You may use over the counter ibuprofen or acetaminophen as needed.  °For a sore throat, over the counter products such as [employer] Peroxyl Mouth Sore Rinse or Chloraseptic Sore Throat Spray may provide some temporary relief. ° ° ° ° °

## 2020-08-15 LAB — CULTURE, GROUP A STREP (THRC)

## 2021-02-08 ENCOUNTER — Emergency Department (HOSPITAL_COMMUNITY)
Admission: EM | Admit: 2021-02-08 | Discharge: 2021-02-08 | Disposition: A | Discharge status: Home / Self Care | Payer: Self-pay | Attending: Emergency Medicine | Admitting: Emergency Medicine

## 2021-02-08 ENCOUNTER — Other Ambulatory Visit: Payer: Self-pay

## 2021-02-08 ENCOUNTER — Encounter (HOSPITAL_COMMUNITY): Payer: Self-pay | Admitting: Emergency Medicine

## 2021-02-08 DIAGNOSIS — S0990XA Unspecified injury of head, initial encounter: Secondary | ICD-10-CM | POA: Insufficient documentation

## 2021-02-08 DIAGNOSIS — R110 Nausea: Secondary | ICD-10-CM | POA: Insufficient documentation

## 2021-02-08 DIAGNOSIS — Y9239 Other specified sports and athletic area as the place of occurrence of the external cause: Secondary | ICD-10-CM | POA: Insufficient documentation

## 2021-02-08 DIAGNOSIS — W228XXA Striking against or struck by other objects, initial encounter: Secondary | ICD-10-CM | POA: Insufficient documentation

## 2021-02-08 MED ORDER — ONDANSETRON 4 MG PO TBDP: 4.0000 mg | ORAL_TABLET | Freq: Three times a day (TID) | ORAL | 0 refills | Status: AC | PRN
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
## 2021-02-08 NOTE — Discharge Instructions (Addendum)
Follow up with the concussion clinic as needed. Zofran as needed for nausea and vomiting. Rest- limit TV/phone/computer time.

## 2021-02-08 NOTE — ED Notes (Signed)
Discharged by PA at triage. 

## 2021-02-08 NOTE — ED Provider Notes (Signed)
[facility] EMERGENCY DEPARTMENT Provider Note   CSN: [id number] Arrival date & time: 02/08/21  8099     History No chief complaint on file.   Sabrina Berry is a 30 y.o. female.  30 year old female presents with complaint of possible concussion. Patient was at the gym on 02/05/21, leaned over to pick up her water bottle and hit her left forehead on a metal bar. No LOC, felt fine at the time. Not on blood thinners. Continues to have an occasional head pressure with nausea. No vomiting, no changes in vision/speech/gait. No other injuries or concerns.       History reviewed. No pertinent past medical history.  There are no problems to display for this patient.   History reviewed. No pertinent surgical history.   OB History   No obstetric history on file.     No family history on file.  Social History   Tobacco Use   Smoking status: Never   Smokeless tobacco: Never  Vaping Use   Vaping Use: Never used  Substance Use Topics   Alcohol use: Yes    Comment: occ   Drug use: No    Home Medications Prior to Admission medications   Medication Sig Start Date End Date Taking? Authorizing Provider  ondansetron (ZOFRAN ODT) 4 MG disintegrating tablet Take 1 tablet (4 mg total) by mouth every 8 (eight) hours as needed for nausea or vomiting. 02/08/21  Yes Jeannie Fend, PA-C  acetaminophen (TYLENOL) 500 MG tablet Take 1 tablet (500 mg total) by mouth every 6 (six) hours as needed. 08/01/15   Cheri Fowler, PA-C  azithromycin (ZITHROMAX) 250 MG tablet Take 1 tablet (250 mg total) by mouth daily. Take first 2 tablets together, then 1 every day until finished. 08/12/20   Mardella Layman, MD  diazepam (VALIUM) 5 MG tablet Take 1 tablet (5 mg total) by mouth every 6 (six) hours as needed for muscle spasms. 08/01/15   Cheri Fowler, PA-C  ibuprofen (ADVIL,MOTRIN) 600 MG tablet Take 1 tablet (600 mg total) by mouth every 6 (six) hours as needed. 03/24/18   McDonald, Mia A, PA-C   JUNEL 1.5/30 1.5-30 MG-MCG tablet Take 1 tablet by mouth daily. 07/01/20   [provider]    Allergies    Penicillins  Review of Systems   Review of Systems  Eyes:  Negative for visual disturbance.  Gastrointestinal:  Positive for nausea. Negative for vomiting.  Musculoskeletal:  Negative for arthralgias, gait problem, myalgias, neck pain and neck stiffness.  Skin:  Negative for rash and wound.  Allergic/Immunologic: Negative for immunocompromised state.  Neurological:  Negative for speech difficulty, weakness and headaches.  Hematological:  Does not bruise/bleed easily.  Psychiatric/Behavioral:  Negative for confusion.   All other systems reviewed and are negative.  Physical Exam Updated Vital Signs BP 113/82 (BP Location: Left Arm)   Pulse 82   Temp 98.4 F (36.9 C) (Oral)   Resp 16   SpO2 100%   Physical Exam Vitals and nursing note reviewed.  Constitutional:      General: She is not in acute distress.    Appearance: She is well-developed. She is not diaphoretic.  HENT:     Head: Normocephalic and atraumatic.     Nose: Nose normal.     Mouth/Throat:     Mouth: Mucous membranes are moist.  Eyes:     Extraocular Movements: Extraocular movements intact.     Pupils: Pupils are equal, round, and reactive to  light.  Pulmonary:     Effort: Pulmonary effort is normal.  Musculoskeletal:     Cervical back: Normal range of motion and neck supple. No tenderness.  Skin:    General: Skin is warm and dry.     Findings: No erythema or rash.  Neurological:     Mental Status: She is alert and oriented to person, place, and time.     Cranial Nerves: No cranial nerve deficit.     Sensory: No sensory deficit.     Motor: No weakness.     Coordination: Coordination normal.     Gait: Gait normal.  Psychiatric:        Behavior: Behavior normal.    ED Results / Procedures / Treatments   Labs (all labs ordered are listed, but only abnormal results are displayed) Labs  Reviewed - No data to display  EKG None  Radiology No results found.  Procedures Procedures   Medications Ordered in ED Medications - No data to display  ED Course  I have reviewed the triage vital signs and the nursing notes.  Pertinent labs & imaging results that were available during my care of the patient were reviewed by me and considered in my medical decision making (see chart for details).  Clinical Course as of 02/08/21 1127  Wynelle Link Feb 08, 2021  2239 30 year old female with complaint of nausea and head pressure. Exam is unremarkable. Gait normal. Recommend follow up with concussion clinic if needed. Given Zofran to take as needed for nausea. Return precautions given.  [LM]    Clinical Course User Index [LM] Alden Hipp   MDM Rules/Calculators/A&P                           Final Clinical Impression(s) / ED Diagnoses Final diagnoses:  Closed head injury, initial encounter    Rx / DC Orders ED Discharge Orders          Ordered    ondansetron (ZOFRAN ODT) 4 MG disintegrating tablet  Every 8 hours PRN        02/08/21 0908             Jeannie Fend, PA-C 02/08/21 1127    Cheryll Cockayne, MD 02/11/21 [phone]

## 2021-02-11 ENCOUNTER — Telehealth: Payer: Self-pay | Admitting: Family Medicine

## 2021-02-11 NOTE — Telephone Encounter (Signed)
Patient called requesting to schedule an appointment for a concussion. She hit her head on a metal bar while at the gym. She did got to the ED to be evaluation and was told she likely had a concussion. No tests were done while she was there.  Patient is still experiencing nausea, headaches, pressure in her head and a fuzzy feeling.  Please advise on scheduling.   Call back # [phone]

## 2021-02-11 NOTE — Telephone Encounter (Signed)
Left message for patient to call back to schedule.  °

## 2021-02-13 ENCOUNTER — Other Ambulatory Visit: Payer: Self-pay

## 2021-02-13 ENCOUNTER — Encounter: Payer: Self-pay | Admitting: Sports Medicine

## 2021-02-13 ENCOUNTER — Ambulatory Visit (INDEPENDENT_AMBULATORY_CARE_PROVIDER_SITE_OTHER): Payer: Self-pay | Admitting: Sports Medicine

## 2021-02-13 VITALS — BP 130/92 | HR 66 | Ht 62.0 in | Wt 188.0 lb

## 2021-02-13 DIAGNOSIS — S060X0A Concussion without loss of consciousness, initial encounter: Secondary | ICD-10-CM

## 2021-02-13 NOTE — Patient Instructions (Addendum)
Good to see you  Neck exercises given  Pick up the Zofran use as needed for nausea Can use tylenol, ibuprofen, or Excedrin for headaches and neck pain  Limit screen time Start light aerobic activity as tolerated Work note given for sunglasses indoors and take 15 minute rest breaks every two hours as needed See me again in 1 weeks

## 2021-02-13 NOTE — Progress Notes (Signed)
Sabrina Berry D.[facility] [address] [place] [address] Phone: [phone]  Assessment and Plan:     1. Concussion without loss of consciousness, initial encounter  Date of injury was 02/05/2021. Symptom severity scores of 13 And 46 today. The patient was counseled on the nature of the injury, typical course and potential options for further evaluation and treatment. Discussed the importance of compliance with the below recommendations.  -Work: May use sunglasses as needed at work, may take 15-minute rest breaks every 2 hours as needed.  Work note provided - Headache:  OTC analgesics prn headache, encouraged not use then determine school/sports progression - Neck pain: HEP provided.  OTC analgesics as needed.  With persistent neck pain consider PT referral to eval and treat  - Nausea: Patient to pick up previously prescribed Zofran to use as needed  - Encouraged to RTC in 1 week for reassessment or sooner for any concerns or acute changes  - Patient stated understanding of this plan and willingness to comply. All questions were answered.        All pertinent previous records reviewed prior to and during visit.      Subjective:   I, Sabrina Berry, am serving as a scribe for Dr. Richardean Berry  Chief Complaint: Concussion  HPI: 30 year old Female presenting with concussion symptoms.   02/13/21 Patient states that she was at the gym and when she bent down to get her water bottle she hit the left side of her head on a metal bar. Patient felt fine at the time but has now been having continued head pressure with nausea. Patient was given zofran from the ED.    Concussion HPI:  - Injury date: 02/05/2021   - Mechanism of injury: Hit left forehead on metal bar at the gym.    - LOC: no  - Initial evaluation: ED 02/08/2021  - Previous head injuries/concussions: No - Previous imaging: no    - Social history: Works at [employer]    Hospitalization for head injury? No Diagnosed/treated for headache disorder or migraines? No Diagnosed with learning disability Sabrina Berry? No Diagnosed with ADD/ADHD? No Diagnose with Depression, anxiety, or other Psychiatric Disorder? No   Current medications:  Current Outpatient Medications  Medication Sig Dispense Refill   JUNEL 1.5/30 1.5-30 MG-MCG tablet Take 1 tablet by mouth daily.     acetaminophen (TYLENOL) 500 MG tablet Take 1 tablet (500 mg total) by mouth every 6 (six) hours as needed. (Patient not taking: Reported on 02/13/2021) 30 tablet 0   ibuprofen (ADVIL,MOTRIN) 600 MG tablet Take 1 tablet (600 mg total) by mouth every 6 (six) hours as needed. (Patient not taking: Reported on 02/13/2021) 30 tablet 0   ondansetron (ZOFRAN ODT) 4 MG disintegrating tablet Take 1 tablet (4 mg total) by mouth every 8 (eight) hours as needed for nausea or vomiting. (Patient not taking: Reported on 02/13/2021) 12 tablet 0   No current facility-administered medications for this visit.      Objective:     Vitals:   02/13/21 1005  BP: (!) 130/92  Pulse: 66  SpO2: 98%  Weight: 188 lb (85.3 kg)  Height: 5\' 2"  (1.575 m)      Body mass index is 34.39 kg/m.    Physical Exam:     General: Well-appearing, cooperative, sitting comfortably in no acute distress.  Psychiatric: Mood and affect are appropriate.     Today's Symptom Severity Score:  Scores: 0-6  Headache:4 "Pressure in head":6  Neck Pain:4  Nausea or vomiting:6 not vomiting   Dizziness:0  Blurred vision:0  Balance problems:2  Sensitivity to light:2  Sensitivity to noise:0  Feeling slowed down:4  Feeling like "in a fog":4  "Don't feel right": 6  Difficulty concentrating:0  Difficulty remembering:0  Fatigue or low energy:1  Confusion:0  Drowsiness:2  More emotional:0  Irritability:2  Sadness:0  Nervous or Anxious:3  Trouble falling asleep:0   Total number of symptoms: 13/22  Symptom Severity index: 46/132  Worse  with physical activity? No Worse with mental activity? No    Full pain-free cervical PROM: yes    Tandem gait: - Forward, eyes open: 0 errors - Backward, eyes open: 0 errors - Forward, eyes closed: 2 errors - Backward, eyes closed: 3 errors  VOMS:   - Baseline symptoms: none, 0/10  - Smooth pursuits: 0/10  - Vertical Saccades: 0/10  - Horizontal Saccades: Headache, 2/10  - Vertical Vestibular-Ocular Reflex: 0/10  - Horizontal Vestibular-Ocular Reflex: 0/10  - Visual Motion Sensitivity Test: Neck pain, 2/10      Electronically signed by:  Sabrina Berry D.[facility] 10:35 AM 02/13/21

## 2021-02-20 ENCOUNTER — Ambulatory Visit (INDEPENDENT_AMBULATORY_CARE_PROVIDER_SITE_OTHER): Payer: Self-pay | Admitting: Sports Medicine

## 2021-02-20 ENCOUNTER — Other Ambulatory Visit: Payer: Self-pay

## 2021-02-20 VITALS — BP 122/60 | HR 76 | Ht 62.0 in | Wt 188.0 lb

## 2021-02-20 DIAGNOSIS — S060X0D Concussion without loss of consciousness, subsequent encounter: Secondary | ICD-10-CM

## 2021-02-20 NOTE — Patient Instructions (Addendum)
Good to see you  Cleared no restrictions Follow up as needed

## 2021-02-20 NOTE — Progress Notes (Signed)
Sabrina Berry D.[facility] [address] [place] [address] Phone: [phone]  Assessment and Plan:     1. Concussion without loss of consciousness, subsequent encounter    Date of injury was 02/05/2021. Symptom severity scores of 6 and 6 today. Original symptom severity scores were 13 and 46. The patient was counseled on the nature of the injury, typical course and potential options for further evaluation and treatment. Discussed the importance of compliance with the below recommendations.   - Start light aerobic activity while keeping symptoms <3/10 as long as >48 hours from concussive event -  Eliminate screen time as much as possible for first 48 hours from concussive event, then continue limited screen time Patient's symptoms have nearly resolved and should continue to get better.  She has been fully cleared him to return to work and activity without restrictions  - Encouraged to RTC as needed for reassessment or sooner for any concerns or acute changes  - Patient stated understanding of this plan and willingness to comply. All questions were answered.        Pertinent previous records reviewed include previous office visit     Subjective:   I, Sabrina Berry, am serving as a scribe for Dr. Richardean Sale  Chief Complaint: Concussion  HPI: 30 year old female presenting for follow up from concussion   02/20/21 Patient states that she feels a lot better.  Will occasionally have a mild headache, the changes activities and symptoms resolved.  States that work has been accommodating allowing her to take breaks as needed.   Concussion HPI:  - Injury date: 02/05/2021   - Mechanism of injury: hit left forehead on metal bar at the gym   - LOC: no  - Initial evaluation: 02/08/2021  - Previous head injuries/concussions: no   - Previous imaging: no    - Social history: works at [employer]    Hospitalization for head injury?  No Diagnosed/treated for headache disorder or migraines? No Diagnosed with learning disability Sabrina Berry? No Diagnosed with ADD/ADHD? No Diagnose with Depression, anxiety, or other Psychiatric Disorder? No   Current medications:  Current Outpatient Medications  Medication Sig Dispense Refill   acetaminophen (TYLENOL) 500 MG tablet Take 1 tablet (500 mg total) by mouth every 6 (six) hours as needed. (Patient not taking: Reported on 02/13/2021) 30 tablet 0   ibuprofen (ADVIL,MOTRIN) 600 MG tablet Take 1 tablet (600 mg total) by mouth every 6 (six) hours as needed. (Patient not taking: Reported on 02/13/2021) 30 tablet 0   JUNEL 1.5/30 1.5-30 MG-MCG tablet Take 1 tablet by mouth daily.     ondansetron (ZOFRAN ODT) 4 MG disintegrating tablet Take 1 tablet (4 mg total) by mouth every 8 (eight) hours as needed for nausea or vomiting. (Patient not taking: Reported on 02/13/2021) 12 tablet 0   No current facility-administered medications for this visit.      Objective:     There were no vitals filed for this visit.    There is no height or weight on file to calculate BMI.    Physical Exam:     General: Well-appearing, cooperative, sitting comfortably in no acute distress.  Psychiatric: Mood and affect are appropriate.     Today's Symptom Severity Score:  Scores: 0-6  Headache:0 "Pressure in head":0  Neck Pain:0  Nausea or vomiting:0  Dizziness:0  Blurred vision:0  Balance problems:1  Sensitivity to light:0  Sensitivity to noise:0  Feeling slowed down:1  Feeling  like "in a fog":1  "Don't feel right":1  Difficulty concentrating:0  Difficulty remembering:0  Fatigue or low energy:1  Confusion:0  Drowsiness:0  More emotional:0  Irritability:0  Sadness:0  Nervous or Anxious:1  Trouble falling asleep:0   Total number of symptoms: 6/22  Symptom Severity index: 6/132  Worse with physical activity? No Worse with mental activity? No Percent improved: 95%    Full pain-free  cervical PROM: yes    Tandem gait: - Forward, eyes open: 00 errors - Backward, eyes open: 0 errors - Forward, eyes closed: 0 errors - Backward, eyes closed: 2 errors  VOMS:   - Baseline symptoms: 0, 0/10  - Smooth pursuits: 00/10  - Vertical Saccades: 0/10  - Horizontal Saccades:  0/10  - Vertical Vestibular-Ocular Reflex: 0/10  - Horizontal Vestibular-Ocular Reflex: 0/10  - Visual Motion Sensitivity Test:  0/10  - Convergence: 3,3cm (<5 cm normal)     Electronically signed by:  Sabrina Berry D.[facility] 7:37 AM 02/20/21

## 2021-04-09 ENCOUNTER — Encounter (HOSPITAL_COMMUNITY): Payer: Self-pay | Admitting: *Deleted

## 2021-04-09 ENCOUNTER — Emergency Department (HOSPITAL_COMMUNITY)
Admission: EM | Admit: 2021-04-09 | Discharge: 2021-04-09 | Disposition: A | Discharge status: Home / Self Care | Payer: Self-pay | Attending: Emergency Medicine | Admitting: Emergency Medicine

## 2021-04-09 ENCOUNTER — Other Ambulatory Visit: Payer: Self-pay

## 2021-04-09 DIAGNOSIS — R110 Nausea: Secondary | ICD-10-CM | POA: Insufficient documentation

## 2021-04-09 DIAGNOSIS — L509 Urticaria, unspecified: Secondary | ICD-10-CM | POA: Insufficient documentation

## 2021-04-09 DIAGNOSIS — T7840XA Allergy, unspecified, initial encounter: Secondary | ICD-10-CM

## 2021-04-09 DIAGNOSIS — R000 Tachycardia, unspecified: Secondary | ICD-10-CM | POA: Insufficient documentation

## 2021-04-09 MED ORDER — AZITHROMYCIN 250 MG PO TABS
1000.0000 mg | ORAL_TABLET | Freq: Once | ORAL | Status: AC
  Administered 2021-04-09: 1000 mg via ORAL
  Filled 2021-04-09: qty 4

## 2021-04-09 MED ORDER — DIPHENHYDRAMINE HCL 25 MG PO CAPS
25.0000 mg | ORAL_CAPSULE | Freq: Once | ORAL | Status: AC
  Administered 2021-04-09: 25 mg via ORAL
  Filled 2021-04-09: qty 1

## 2021-04-09 NOTE — ED Notes (Signed)
Med given 

## 2021-04-09 NOTE — ED Provider Notes (Signed)
[facility] [place] EMERGENCY DEPARTMENT Provider Note   CSN: [id number] Arrival date & time: 04/09/21  0433     History Chief Complaint  Patient presents with   Allergic Reaction    Sabrina Berry is a 30 y.o. female.  Patient presents to the emergency department with a chief complaint of allergic reaction.  She states that she was started on doxycycline yesterday morning for treatment of chlamydia.  She states that tonight she began having itching and nausea.  States that she also felt like her chest was tight, but she states that she feels anxious and nervous.  She denies any throat swelling, shortness of breath, vomiting, or diarrhea.  She states that she has had development of a slight rash.  She denies any other associated symptoms.  Denies any treatments prior to arrival.  The history is provided by the patient. No language interpreter was used.      History reviewed. No pertinent past medical history.  There are no problems to display for this patient.   History reviewed. No pertinent surgical history.   OB History   No obstetric history on file.     No family history on file.  Social History   Tobacco Use   Smoking status: Never   Smokeless tobacco: Never  Vaping Use   Vaping Use: Never used  Substance Use Topics   Alcohol use: Yes    Comment: occ   Drug use: No    Home Medications Prior to Admission medications   Medication Sig Start Date End Date Taking? Authorizing Provider  acetaminophen (TYLENOL) 500 MG tablet Take 1 tablet (500 mg total) by mouth every 6 (six) hours as needed. Patient not taking: No sig reported 08/01/15   Cheri Fowler, PA-C  ibuprofen (ADVIL,MOTRIN) 600 MG tablet Take 1 tablet (600 mg total) by mouth every 6 (six) hours as needed. Patient not taking: No sig reported 03/24/18   McDonald, Mia A, PA-C  JUNEL 1.5/30 1.5-30 MG-MCG tablet Take 1 tablet by mouth daily. 07/01/20   [provider]  ondansetron (ZOFRAN  ODT) 4 MG disintegrating tablet Take 1 tablet (4 mg total) by mouth every 8 (eight) hours as needed for nausea or vomiting. Patient not taking: No sig reported 02/08/21   Jeannie Fend, PA-C    Allergies    Penicillins  Review of Systems   Review of Systems  All other systems reviewed and are negative.  Physical Exam Updated Vital Signs BP (!) 114/91 (BP Location: Right Arm)   Pulse (!) 123   Temp 98.3 F (36.8 C) (Oral)   Resp 14   Ht 5\' 2"  (1.575 m)   Wt 85.7 kg   SpO2 100%   BMI 34.57 kg/m   Physical Exam Vitals and nursing note reviewed.  Constitutional:      General: She is not in acute distress.    Appearance: She is well-developed.  HENT:     Head: Normocephalic and atraumatic.     Mouth/Throat:     Comments: Oropharynx clear, no stridor, normal phonation Eyes:     Conjunctiva/sclera: Conjunctivae normal.  Cardiovascular:     Rate and Rhythm: Tachycardia present.     Heart sounds: No murmur heard. Pulmonary:     Effort: Pulmonary effort is normal. No respiratory distress.     Breath sounds: No wheezing.     Comments: Lung sounds are clear bilaterally Abdominal:     General: There is no distension.  Musculoskeletal:  Cervical back: Neck supple.     Comments: Moves all extremities  Skin:    General: Skin is warm and dry.     Comments: Very mild urticaria  Neurological:     Mental Status: She is alert and oriented to person, place, and time.  Psychiatric:        Mood and Affect: Mood normal.        Behavior: Behavior normal.    ED Results / Procedures / Treatments   Labs (all labs ordered are listed, but only abnormal results are displayed) Labs Reviewed - No data to display  EKG None  Radiology No results found.  Procedures Procedures   Medications Ordered in ED Medications  diphenhydrAMINE (BENADRYL) capsule 25 mg (has no administration in time range)  azithromycin (ZITHROMAX) tablet 1,000 mg (has no administration in time range)     ED Course  I have reviewed the triage vital signs and the nursing notes.  Pertinent labs & imaging results that were available during my care of the patient were reviewed by me and considered in my medical decision making (see chart for details).    MDM Rules/Calculators/A&P                           Patient here with allergic reaction to doxycycline.  Started Doxy yesterday for treatment of chlamydia.  Now is having mild hives and itching and nausea.  Denies SOB or vomiting.  No throat swelling.    Feels improved after benadryl.  Given azithro for treatment of chlamydia. Final Clinical Impression(s) / ED Diagnoses Final diagnoses:  Allergic reaction, initial encounter    Rx / DC Orders ED Discharge Orders     None        Roxy Horseman, PA-C 04/09/21 0601    Palumbo, April, MD 04/09/21 2321

## 2021-04-09 NOTE — ED Triage Notes (Signed)
States she thinks she is having an allergic reaction to the antibiotic she is taking . Doxycycline states she started taking Wed. Am for STD, c/o itching and skin feels hot. States her chest started feeling tight on the drive her.

## 2022-01-09 ENCOUNTER — Emergency Department (HOSPITAL_COMMUNITY): Payer: Self-pay

## 2022-01-09 ENCOUNTER — Emergency Department (HOSPITAL_COMMUNITY)
Admission: EM | Admit: 2022-01-09 | Discharge: 2022-01-09 | Disposition: A | Discharge status: Home / Self Care | Payer: Self-pay | Attending: Emergency Medicine | Admitting: Emergency Medicine

## 2022-01-09 ENCOUNTER — Other Ambulatory Visit: Payer: Self-pay

## 2022-01-09 DIAGNOSIS — R519 Headache, unspecified: Secondary | ICD-10-CM | POA: Insufficient documentation

## 2022-01-09 DIAGNOSIS — R0789 Other chest pain: Secondary | ICD-10-CM | POA: Insufficient documentation

## 2022-01-09 DIAGNOSIS — M79602 Pain in left arm: Secondary | ICD-10-CM | POA: Insufficient documentation

## 2022-01-09 DIAGNOSIS — R0602 Shortness of breath: Secondary | ICD-10-CM | POA: Insufficient documentation

## 2022-01-09 LAB — CBC
HCT: 38 % (ref 36.0–46.0)
Hemoglobin: 13.2 g/dL (ref 12.0–15.0)
MCH: 32 pg (ref 26.0–34.0)
MCHC: 34.7 g/dL (ref 30.0–36.0)
MCV: 92 fL (ref 80.0–100.0)
Platelets: 345 10*3/uL (ref 150–400)
RBC: 4.13 MIL/uL (ref 3.87–5.11)
RDW: 11.8 % (ref 11.5–15.5)
WBC: 7.4 10*3/uL (ref 4.0–10.5)
nRBC: 0 % (ref 0.0–0.2)

## 2022-01-09 LAB — BASIC METABOLIC PANEL
Anion gap: 6 (ref 5–15)
BUN: 9 mg/dL (ref 6–20)
CO2: 26 mmol/L (ref 22–32)
Calcium: 8.6 mg/dL — ABNORMAL LOW (ref 8.9–10.3)
Chloride: 106 mmol/L (ref 98–111)
Creatinine, Ser: 0.93 mg/dL (ref 0.44–1.00)
GFR, Estimated: >60 mL/min (ref >60)
Glucose, Bld: 89 mg/dL (ref 70–99)
Potassium: 3.8 mmol/L (ref 3.5–5.1)
Sodium: 138 mmol/L (ref 135–145)

## 2022-01-09 LAB — I-STAT BETA HCG BLOOD, ED (MC, WL, AP ONLY): I-stat hCG, quantitative: <5 m[IU]/mL (ref <5)

## 2022-01-09 LAB — TROPONIN I (HIGH SENSITIVITY)
Troponin I (High Sensitivity): 3 ng/L (ref <18)
Troponin I (High Sensitivity): <2 ng/L (ref <18)

## 2022-01-09 NOTE — Discharge Instructions (Signed)
As we discussed, you likely have chest wall pain from picking up heavy items.   Avoid heavy lifting for a week   Take tylenol, motrin for pain   Your troponin is negative right now.  See your doctor for follow-up  Return to ER if you have worse chest pain, trouble breathing.

## 2022-01-09 NOTE — ED Triage Notes (Signed)
Pt with central chest pain since this morning, intermittent.  Left arm has stabbing pain as well.  No n/v/d or shob

## 2022-01-09 NOTE — ED Provider Notes (Signed)
[facility] EMERGENCY DEPARTMENT Provider Note   CSN: [id number] Arrival date & time: 01/09/22  1906     History  Chief Complaint  Patient presents with   Chest Pain    Sabrina Berry is a 31 y.o. female here presenting with chest pain.  Patient works at [employer] and picks up heavy items all the time.  Patient states that this morning she got to work around 4 AM.  Around 5 AM she noticed some substernal chest pain.  She states that it is worse when she moves.  She has no history of CAD or stents and no recent travel or history of blood clots.  The history is provided by the patient.       Home Medications Prior to Admission medications   Medication Sig Start Date End Date Taking? Authorizing Provider  acetaminophen (TYLENOL) 500 MG tablet Take 1 tablet (500 mg total) by mouth every 6 (six) hours as needed. Patient not taking: No sig reported 08/01/15   Cheri Fowler, PA-C  ibuprofen (ADVIL,MOTRIN) 600 MG tablet Take 1 tablet (600 mg total) by mouth every 6 (six) hours as needed. Patient not taking: No sig reported 03/24/18   McDonald, Mia A, PA-C  JUNEL 1.5/30 1.5-30 MG-MCG tablet Take 1 tablet by mouth daily. 07/01/20   [provider]  ondansetron (ZOFRAN ODT) 4 MG disintegrating tablet Take 1 tablet (4 mg total) by mouth every 8 (eight) hours as needed for nausea or vomiting. Patient not taking: No sig reported 02/08/21   Jeannie Fend, PA-C      Allergies    Penicillins    Review of Systems   Review of Systems  Cardiovascular:  Positive for chest pain.  All other systems reviewed and are negative.   Physical Exam Updated Vital Signs BP 108/77 (BP Location: Left Arm)   Pulse 76   Temp 98.3 F (36.8 C) (Oral)   Resp 16   SpO2 100%  Physical Exam Vitals and nursing note reviewed.  Constitutional:      Appearance: She is well-developed.  HENT:     Head: Normocephalic.  Eyes:     Extraocular Movements: Extraocular movements intact.      Pupils: Pupils are equal, round, and reactive to light.  Cardiovascular:     Rate and Rhythm: Normal rate and regular rhythm.     Heart sounds: Normal heart sounds.  Chest:     Comments: Reproducible tenderness in the sternal area Abdominal:     Palpations: Abdomen is soft.  Musculoskeletal:        General: Normal range of motion.     Cervical back: Normal range of motion.  Skin:    General: Skin is warm.     Capillary Refill: Capillary refill takes less than 2 seconds.  Neurological:     General: No focal deficit present.     Mental Status: She is alert and oriented to person, place, and time.  Psychiatric:        Mood and Affect: Mood normal.        Behavior: Behavior normal.     ED Results / Procedures / Treatments   Labs (all labs ordered are listed, but only abnormal results are displayed) Labs Reviewed  BASIC METABOLIC PANEL - Abnormal; Notable for the following components:      Result Value   Calcium 8.6 (*)    All other components within normal limits  CBC  I-STAT BETA HCG BLOOD, ED ([facility], [facility], [facility]  ONLY)  TROPONIN I (HIGH SENSITIVITY)  TROPONIN I (HIGH SENSITIVITY)    EKG None  Radiology DG Chest 2 View  Result Date: 01/09/2022 CLINICAL DATA:  Headache, short of breath, chest pain, left arm pain EXAM: CHEST - 2 VIEW COMPARISON:  None Available. FINDINGS: The heart size and mediastinal contours are within normal limits. Both lungs are clear. The visualized skeletal structures are unremarkable. IMPRESSION: No active cardiopulmonary disease. Electronically Signed   By: Sharlet Salina M.D.   On: 01/09/2022 19:53    Procedures Procedures    Medications Ordered in ED Medications - No data to display  ED Course/ Medical Decision Making/ A&P                           Medical Decision Making [name] FILLER is a 30 y.o. female here presenting with chest pain.  Patient likely has chest wall pain from heavy lifting.  She has no history of CAD and no risk factors for  CAD.  Plan to get troponin x2 and chest x-ray and labs.  I doubt dissection or PE  11:29 PM I reviewed patient's labs and independently interpreted x-rays.  Troponin negative x2 and chest x-ray unremarkable.  Stable for discharge   Problems Addressed: Chest wall pain: acute illness or injury  Amount and/or Complexity of Data Reviewed Labs: ordered. Decision-making details documented in ED Course. Radiology: ordered and independent interpretation performed. Decision-making details documented in ED Course. ECG/medicine tests: ordered and independent interpretation performed. Decision-making details documented in ED Course.    Final Clinical Impression(s) / ED Diagnoses Final diagnoses:  Chest wall pain    Rx / DC Orders ED Discharge Orders     None         Charlynne Pander, MD 01/09/22 2329

## 2022-01-16 ENCOUNTER — Encounter (HOSPITAL_COMMUNITY): Payer: Self-pay | Admitting: Emergency Medicine

## 2022-01-16 ENCOUNTER — Other Ambulatory Visit: Payer: Self-pay

## 2022-01-16 ENCOUNTER — Emergency Department (HOSPITAL_COMMUNITY)
Admission: EM | Admit: 2022-01-16 | Discharge: 2022-01-16 | Disposition: A | Discharge status: Home / Self Care | Payer: Self-pay | Attending: Emergency Medicine | Admitting: Emergency Medicine

## 2022-01-16 DIAGNOSIS — T161XXA Foreign body in right ear, initial encounter: Secondary | ICD-10-CM | POA: Insufficient documentation

## 2022-01-16 DIAGNOSIS — X58XXXA Exposure to other specified factors, initial encounter: Secondary | ICD-10-CM | POA: Insufficient documentation

## 2022-01-16 NOTE — ED Triage Notes (Signed)
Patient reports insect entered her right ear last night . Mother instilled vinegar prior to arrival .

## 2022-01-16 NOTE — ED Provider Notes (Signed)
[facility] EMERGENCY DEPARTMENT Provider Note   CSN: [id number] Arrival date & time: 01/16/22  0429     History  Chief Complaint  Patient presents with   Insect in right ear    Sabrina Berry is a 31 y.o. female.  The history is provided by the patient and medical records.   30 year old female presenting to the ED after feeling insect when turning her right ear last night.  Unsure exactly what it was.  Mother applied vinegar prior to arrival.  She denies feeling any movement in her ear.  No changes in hearing.  Home Medications Prior to Admission medications   Medication Sig Start Date End Date Taking? Authorizing Provider  acetaminophen (TYLENOL) 500 MG tablet Take 1 tablet (500 mg total) by mouth every 6 (six) hours as needed. Patient not taking: No sig reported 08/01/15   Cheri Fowler, PA-C  ibuprofen (ADVIL,MOTRIN) 600 MG tablet Take 1 tablet (600 mg total) by mouth every 6 (six) hours as needed. Patient not taking: No sig reported 03/24/18   McDonald, Mia A, PA-C  JUNEL 1.5/30 1.5-30 MG-MCG tablet Take 1 tablet by mouth daily. 07/01/20   [provider]  ondansetron (ZOFRAN ODT) 4 MG disintegrating tablet Take 1 tablet (4 mg total) by mouth every 8 (eight) hours as needed for nausea or vomiting. Patient not taking: No sig reported 02/08/21   Jeannie Fend, PA-C      Allergies    Penicillins    Review of Systems   Review of Systems  HENT:         FB right ear  All other systems reviewed and are negative.   Physical Exam Updated Vital Signs BP (!) 149/113 (BP Location: Right Arm)   Pulse 83   Temp 98.1 F (36.7 C) (Oral)   Resp 18   SpO2 100%   Physical Exam Vitals and nursing note reviewed.  Constitutional:      Appearance: She is well-developed.  HENT:     Head: Normocephalic and atraumatic.     Ears:     Comments: Small ant present in right ear canal Eyes:     Conjunctiva/sclera: Conjunctivae normal.     Pupils: Pupils  are equal, round, and reactive to light.  Cardiovascular:     Rate and Rhythm: Normal rate and regular rhythm.     Heart sounds: Normal heart sounds.  Pulmonary:     Effort: Pulmonary effort is normal.     Breath sounds: Normal breath sounds.  Abdominal:     General: Bowel sounds are normal.     Palpations: Abdomen is soft.  Musculoskeletal:        General: Normal range of motion.     Cervical back: Normal range of motion.  Skin:    General: Skin is warm and dry.  Neurological:     Mental Status: She is alert and oriented to person, place, and time.     ED Results / Procedures / Treatments   Labs (all labs ordered are listed, but only abnormal results are displayed) Labs Reviewed - No data to display  EKG None  Radiology No results found.  Procedures .Foreign Body Removal  Date/Time: 01/16/2022 5:01 AM  Performed by: Garlon Hatchet, PA-C Authorized by: Garlon Hatchet, PA-C  Consent: Verbal consent obtained. Risks and benefits: risks, benefits and alternatives were discussed Consent given by: patient Patient understanding: patient states understanding of the procedure being performed Required items: required blood products, implants,  devices, and special equipment available Patient identity confirmed: verbally with patient Body area: ear Location details: right ear  Sedation: Patient sedated: no  Patient restrained: no Localization method: magnification Removal mechanism: irrigation and curette Complexity: simple 1 objects recovered. Objects recovered: ant Post-procedure assessment: foreign body removed Patient tolerance: patient tolerated the procedure well with no immediate complications      Medications Ordered in ED Medications - No data to display  ED Course/ Medical Decision Making/ A&P                           Medical Decision Making  31 year old female here with small and her right ear.  This was removed using irrigation and a curette.   Patient tolerated well.  TM remains intact afterwards.  Stable for discharge.  Can return here for new concerns.  Final Clinical Impression(s) / ED Diagnoses Final diagnoses:  Foreign body of right ear, initial encounter    Rx / DC Orders ED Discharge Orders     None         Garlon Hatchet, PA-C 01/16/22 Tyler Deis, MD 01/16/22 [phone]

## 2022-04-10 ENCOUNTER — Other Ambulatory Visit: Payer: Self-pay

## 2022-04-10 ENCOUNTER — Emergency Department (HOSPITAL_COMMUNITY)
Admission: EM | Admit: 2022-04-10 | Discharge: 2022-04-10 | Disposition: A | Discharge status: Home / Self Care | Payer: Self-pay | Attending: Emergency Medicine | Admitting: Emergency Medicine

## 2022-04-10 ENCOUNTER — Encounter (HOSPITAL_COMMUNITY): Payer: Self-pay

## 2022-04-10 DIAGNOSIS — R110 Nausea: Secondary | ICD-10-CM | POA: Insufficient documentation

## 2022-04-10 DIAGNOSIS — R519 Headache, unspecified: Secondary | ICD-10-CM | POA: Insufficient documentation

## 2022-04-10 LAB — CBC
HCT: 40 % (ref 36.0–46.0)
Hemoglobin: 13.7 g/dL (ref 12.0–15.0)
MCH: 31.9 pg (ref 26.0–34.0)
MCHC: 34.3 g/dL (ref 30.0–36.0)
MCV: 93 fL (ref 80.0–100.0)
Platelets: 307 10*3/uL (ref 150–400)
RBC: 4.3 MIL/uL (ref 3.87–5.11)
RDW: 11.8 % (ref 11.5–15.5)
WBC: 6.4 10*3/uL (ref 4.0–10.5)
nRBC: 0 % (ref 0.0–0.2)

## 2022-04-10 LAB — COMPREHENSIVE METABOLIC PANEL
ALT: 16 U/L (ref 0–44)
AST: 19 U/L (ref 15–41)
Albumin: 3.7 g/dL (ref 3.5–5.0)
Alkaline Phosphatase: 51 U/L (ref 38–126)
Anion gap: 6 (ref 5–15)
BUN: 7 mg/dL (ref 6–20)
CO2: 23 mmol/L (ref 22–32)
Calcium: 8.7 mg/dL — ABNORMAL LOW (ref 8.9–10.3)
Chloride: 107 mmol/L (ref 98–111)
Creatinine, Ser: 0.81 mg/dL (ref 0.44–1.00)
GFR, Estimated: >60 mL/min (ref >60)
Glucose, Bld: 81 mg/dL (ref 70–99)
Potassium: 4 mmol/L (ref 3.5–5.1)
Sodium: 136 mmol/L (ref 135–145)
Total Bilirubin: 0.8 mg/dL (ref 0.3–1.2)
Total Protein: 6.7 g/dL (ref 6.5–8.1)

## 2022-04-10 LAB — URINALYSIS, ROUTINE W REFLEX MICROSCOPIC
Bilirubin Urine: NEGATIVE
Glucose, UA: NEGATIVE mg/dL
Hgb urine dipstick: NEGATIVE
Ketones, ur: NEGATIVE mg/dL
Nitrite: NEGATIVE
Protein, ur: NEGATIVE mg/dL
Specific Gravity, Urine: 1.018 (ref 1.005–1.030)
pH: 5 (ref 5.0–8.0)

## 2022-04-10 LAB — I-STAT BETA HCG BLOOD, ED (MC, WL, AP ONLY): I-stat hCG, quantitative: <5 m[IU]/mL (ref <5)

## 2022-04-10 LAB — LIPASE, BLOOD: Lipase: 27 U/L (ref 11–51)

## 2022-04-10 MED ORDER — IBUPROFEN 400 MG PO TABS
600.0000 mg | ORAL_TABLET | Freq: Once | ORAL | Status: AC
Start: 2022-04-10 — End: 2022-04-10
  Administered 2022-04-10: 600 mg via ORAL
  Filled 2022-04-10: qty 1

## 2022-04-10 MED ORDER — SODIUM CHLORIDE 0.9 % IV BOLUS
500.0000 mL | Freq: Once | INTRAVENOUS | Status: AC
  Administered 2022-04-10: 500 mL via INTRAVENOUS

## 2022-04-10 MED ORDER — METOCLOPRAMIDE HCL 5 MG/ML IJ SOLN
10.0000 mg | Freq: Once | INTRAMUSCULAR | Status: AC
  Administered 2022-04-10: 10 mg via INTRAVENOUS
  Filled 2022-04-10: qty 2

## 2022-04-10 MED ORDER — DIPHENHYDRAMINE HCL 50 MG/ML IJ SOLN: 25.0000 mg | Freq: Once | INTRAMUSCULAR | Status: DC

## 2022-04-10 NOTE — ED Provider Notes (Signed)
[place] EMERGENCY DEPARTMENT Provider Note   CSN: [id number] Arrival date & time: 04/10/22  1004     History  Chief Complaint  Patient presents with   Headache    Sabrina Berry is a 31 y.o. female with no significant past medical history presenting to the emergency department for evaluation of headache.  Patient states she has had headaches for the last 3 days.  The pain is on the right side of her head, sharp, intermittent, improved with Tylenol.  This morning she started to feel nausea without vomiting.  Denies any injury to her head.  States she has been feeling sensitive to light.  Denies fever, chest pain, shortness of breath, diarrhea, rash, urinary symptoms.   Headache      Home Medications Prior to Admission medications   Medication Sig Start Date End Date Taking? Authorizing Provider  acetaminophen (TYLENOL) 500 MG tablet Take 1 tablet (500 mg total) by mouth every 6 (six) hours as needed. Patient not taking: No sig reported 08/01/15   Gloriann Loan, PA-C  ibuprofen (ADVIL,MOTRIN) 600 MG tablet Take 1 tablet (600 mg total) by mouth every 6 (six) hours as needed. Patient not taking: No sig reported 03/24/18   McDonald, Mia A, PA-C  JUNEL 1.5/30 1.5-30 MG-MCG tablet Take 1 tablet by mouth daily. 07/01/20   [provider]  ondansetron (ZOFRAN ODT) 4 MG disintegrating tablet Take 1 tablet (4 mg total) by mouth every 8 (eight) hours as needed for nausea or vomiting. Patient not taking: No sig reported 02/08/21   Suella Broad A, PA-C      Allergies    Penicillins and Doxycycline    Review of Systems   Review of Systems  Neurological:  Positive for headaches.    Physical Exam Updated Vital Signs BP 111/82   Pulse 80   Temp 98.4 F (36.9 C) (Oral)   Resp 14   Ht 5\' 2"  (1.575 m)   Wt 83.9 kg   SpO2 100%   BMI 33.84 kg/m  Physical Exam Vitals and nursing note reviewed.  Constitutional:      Appearance: Normal appearance.  HENT:      Head: Normocephalic and atraumatic.     Mouth/Throat:     Mouth: Mucous membranes are moist.  Eyes:     General: No scleral icterus. Cardiovascular:     Rate and Rhythm: Normal rate and regular rhythm.     Pulses: Normal pulses.     Heart sounds: Normal heart sounds.  Pulmonary:     Effort: Pulmonary effort is normal.     Breath sounds: Normal breath sounds.  Abdominal:     General: Abdomen is flat.     Palpations: Abdomen is soft.     Tenderness: There is no abdominal tenderness.  Musculoskeletal:        General: No deformity.  Skin:    General: Skin is warm.     Findings: No rash.  Neurological:     General: No focal deficit present.     Mental Status: She is alert.     Comments: Moving bilateral upper extremities.  Normal strength throughout.  Pinpoint pupils with intact EOMs.  Cranial nerves II through XII grossly intact.    Psychiatric:        Mood and Affect: Mood normal.     ED Results / Procedures / Treatments   Labs (all labs ordered are listed, but only abnormal results are displayed) Labs Reviewed  COMPREHENSIVE METABOLIC PANEL -  Abnormal; Notable for the following components:      Result Value   Calcium 8.7 (*)    All other components within normal limits  URINALYSIS, ROUTINE W REFLEX MICROSCOPIC - Abnormal; Notable for the following components:   APPearance HAZY (*)    Leukocytes,Ua TRACE (*)    Bacteria, UA RARE (*)    All other components within normal limits  LIPASE, BLOOD  CBC  I-STAT BETA HCG BLOOD, ED ([facility], [facility], [facility] ONLY)    EKG None  Radiology No results found.  Procedures Procedures    Medications Ordered in ED Medications - No data to display  ED Course/ Medical Decision Making/ A&P Clinical Course as of 04/10/22 2055  Sat Apr 10, 2022  1602 Re-evaluated and noted improvement of symptoms with treatment regimen. Offered work note to which patient declines at this time. Discussed discharge treatment plan. Pt agreeable at this  time. Pt appears safe for discharge. [SB]    Clinical Course User Index [SB] Blue, Soijett A, PA-C                           Medical Decision Making Risk Prescription drug management.   This patient presents to the ED for headache, this involves an extensive number of treatment options, and is a complaint that carries with a high risk of complications and morbidity.  The differential diagnosis includes migraine headache, tension type headache, ICH, CVA .  This is not an exhaustive list.  Comorbidities that complicate the patient evaluation See HPI  Social determinants of health NA  Additional history obtained: Additional history obtained from EMR. External records from outside source obtained and review including prior labs  Cardiac monitoring/EKG: The patient was maintained on a cardiac monitor.  I personally reviewed and interpreted the cardiac monitor which showed an underlying rhythm of: Sinus rhythm.  Lab tests: I ordered and personally interpreted labs.  The pertinent results include WBC normal. Hbg normal. Platelets normal. No electrolyte abnormalities noted. BUN, creatinine normal. No transaminitis. UA significant for no acute abnormality.  Beta hCG blood negative.  Imaging studies: None as patient does not have any neuro defecits. Neuro exam unremarkable. No head injury.  Problem list/ ED course/ Critical interventions/ Medical management: HPI: See above Vital signs within normal range and stable throughout visit. Laboratory/imaging studies significant for: See above. On physical examination, patient is afebrile and appears in no acute distress. Heart sounds normal. Lungs are clear. Abdomen is soft. No rigidity. No guarding. Headache is on the R side of head, sharp, intermittent with associated photophobia and nausea without vomiting. Patient's clinical presentations and laboratory/imaging studies are most concerned for tension-type headache vs migraine headache.   Low suspicions for CVA as neuro exam benign. ICH unlikely as patient has had no recent head injuries. Ibuprofen, Reglan and IV fluid ordered. Reevaluation of the patient after these medications will be done by next provider. I have reviewed the patient home medicines and have made adjustments as needed.  Consultations obtained: NA  Disposition Patient care signed out at shift change to [facility], PA-C   This chart was dictated using voice recognition software.  Despite best efforts to proofread,  errors can occur which can change the documentation meaning.          Final Clinical Impression(s) / ED Diagnoses Final diagnoses:  Acute nonintractable headache, unspecified headache type    Rx / DC Orders ED Discharge Orders     None  Rex Kras, [place] 04/10/22 2056    Leanord Asal K, DO 04/12/22 [phone]

## 2022-04-10 NOTE — Discharge Instructions (Addendum)
Please take tylenol/ibuprofen for pain. I recommend close follow-up with PCP for reevaluation.  Please do not hesitate to return to emergency department if worrisome signs symptoms we discussed become apparent.  

## 2022-04-10 NOTE — ED Triage Notes (Signed)
Pt arrived POV from home c/o a headache for several days that is intermittent. Pt states today she became nauseous with her headache. Pt also endorses some constipation and states that is not normal for her.

## 2022-04-10 NOTE — ED Provider Notes (Signed)
Care transferred from Rex Kras, PA-C at time of sign out. See their note for full assessment.   Briefly: Patient is 31 y.o. female who presents to the ED with concerns for headache onset 3 days.     Plan: Plan per previous PA-C: pending pain management and IVF.  Labs Reviewed  COMPREHENSIVE METABOLIC PANEL - Abnormal; Notable for the following components:      Result Value   Calcium 8.7 (*)    All other components within normal limits  URINALYSIS, ROUTINE W REFLEX MICROSCOPIC - Abnormal; Notable for the following components:   APPearance HAZY (*)    Leukocytes,Ua TRACE (*)    Bacteria, UA RARE (*)    All other components within normal limits  LIPASE, BLOOD  CBC  I-STAT BETA HCG BLOOD, ED ([facility], [facility], [facility] ONLY)    Clinical Course as of 04/10/22 1602  Sat Apr 10, 2022  1602 Re-evaluated and noted improvement of symptoms with treatment regimen. Offered work note to which patient declines at this time. Discussed discharge treatment plan. Pt agreeable at this time. Pt appears safe for discharge. [SB]    Clinical Course User Index [SB] Montae Stager A, PA-C     Patient notes improvement of symptoms with treatment regimen in the ED.  Feel that patient is safe for discharge at this time.  Offered patient a work note, patient declines at this time.  Discussed with patient importance of maintaining hydration status throughout the day as she works primarily third shift at [employer]. Supportive care and strict return precautions discussed with patient. Patient acknowledges and verbalizes understanding. Pt appears safe for discharge. Follow up as indicated in discharge paperwork.    This chart was dictated using voice recognition software, Dragon. Despite the best efforts of this provider to proofread and correct errors, errors may still occur which can change documentation meaning.   Jontue Crumpacker A, PA-C 04/10/22 1603    Audley Hose, MD 04/10/22 1907

## 2022-04-10 NOTE — ED Provider Triage Note (Signed)
Emergency Medicine Provider Triage Evaluation Note  Sabrina Berry , a 31 y.o. female  was evaluated in triage.  Pt complains of abdominal cramping, constipation, headache, nausea. Reports she works at [employer] and has been working a lot recently. Last full bm yesterday. Denies vomiting, fever, chills.  Review of Systems  Positive: Abdominal cramping, constipation, bloating, nausea, headache Negative: Fever, chills, numbness, tingling  Physical Exam  BP 108/79 (BP Location: Right Arm)   Pulse 89   Temp 98.4 F (36.9 C) (Oral)   Resp 18   Ht 5\' 2"  (1.575 m)   Wt 83.9 kg   SpO2 95%   BMI 33.84 kg/m  Gen:   Awake, no distress   Resp:  Normal effort  MSK:   Moves extremities without difficulty  Other:  Cranial nerves II through XII grossly intact.  Intact finger-nose, intact heel-to-shin.  Romberg negative, gait normal.  Alert and oriented x3.  Moves all 4 limbs spontaneously, normal coordination.  No pronator drift.  Intact strength 5 out of 5 bilateral upper and lower extremities.   Medical Decision Making  Medically screening exam initiated at 10:48 AM.  Appropriate orders placed.  MISHELLE HASSAN was informed that the remainder of the evaluation will be completed by another provider, this initial triage assessment does not replace that evaluation, and the importance of remaining in the ED until their evaluation is complete.  Workup initiated   Anselmo Pickler, [place] 04/10/22 1049

## 2022-05-26 ENCOUNTER — Emergency Department (HOSPITAL_COMMUNITY): Payer: Self-pay

## 2022-05-26 ENCOUNTER — Emergency Department (HOSPITAL_COMMUNITY)
Admission: EM | Admit: 2022-05-26 | Discharge: 2022-05-26 | Disposition: A | Discharge status: Home / Self Care | Payer: Self-pay | Attending: Emergency Medicine | Admitting: Emergency Medicine

## 2022-05-26 ENCOUNTER — Encounter (HOSPITAL_COMMUNITY): Payer: Self-pay

## 2022-05-26 ENCOUNTER — Other Ambulatory Visit: Payer: Self-pay

## 2022-05-26 DIAGNOSIS — R1031 Right lower quadrant pain: Secondary | ICD-10-CM | POA: Insufficient documentation

## 2022-05-26 DIAGNOSIS — Z1152 Encounter for screening for COVID-19: Secondary | ICD-10-CM | POA: Insufficient documentation

## 2022-05-26 DIAGNOSIS — J101 Influenza due to other identified influenza virus with other respiratory manifestations: Secondary | ICD-10-CM | POA: Insufficient documentation

## 2022-05-26 DIAGNOSIS — R1084 Generalized abdominal pain: Secondary | ICD-10-CM

## 2022-05-26 LAB — CBC WITH DIFFERENTIAL/PLATELET
Abs Immature Granulocytes: 0.02 10*3/uL (ref 0.00–0.07)
Basophils Absolute: 0 10*3/uL (ref 0.0–0.1)
Basophils Relative: 1 %
Eosinophils Absolute: 0 10*3/uL (ref 0.0–0.5)
Eosinophils Relative: 0 %
HCT: 40.5 % (ref 36.0–46.0)
Hemoglobin: 13.7 g/dL (ref 12.0–15.0)
Immature Granulocytes: 0 %
Lymphocytes Relative: 11 %
Lymphs Abs: 0.5 10*3/uL — ABNORMAL LOW (ref 0.7–4.0)
MCH: 31.3 pg (ref 26.0–34.0)
MCHC: 33.8 g/dL (ref 30.0–36.0)
MCV: 92.5 fL (ref 80.0–100.0)
Monocytes Absolute: 0.5 10*3/uL (ref 0.1–1.0)
Monocytes Relative: 11 %
Neutro Abs: 3.5 10*3/uL (ref 1.7–7.7)
Neutrophils Relative %: 77 %
Platelets: 284 10*3/uL (ref 150–400)
RBC: 4.38 MIL/uL (ref 3.87–5.11)
RDW: 11.7 % (ref 11.5–15.5)
WBC: 4.6 10*3/uL (ref 4.0–10.5)
nRBC: 0 % (ref 0.0–0.2)

## 2022-05-26 LAB — URINALYSIS, ROUTINE W REFLEX MICROSCOPIC
Bacteria, UA: NONE SEEN
Bilirubin Urine: NEGATIVE
Glucose, UA: NEGATIVE mg/dL
Hgb urine dipstick: NEGATIVE
Ketones, ur: 20 mg/dL — AB
Leukocytes,Ua: NEGATIVE
Nitrite: NEGATIVE
Protein, ur: 30 mg/dL — AB
Specific Gravity, Urine: 1.032 — ABNORMAL HIGH (ref 1.005–1.030)
pH: 5 (ref 5.0–8.0)

## 2022-05-26 LAB — COMPREHENSIVE METABOLIC PANEL
ALT: 18 U/L (ref 0–44)
AST: 20 U/L (ref 15–41)
Albumin: 4.3 g/dL (ref 3.5–5.0)
Alkaline Phosphatase: 52 U/L (ref 38–126)
Anion gap: 8 (ref 5–15)
BUN: 13 mg/dL (ref 6–20)
CO2: 25 mmol/L (ref 22–32)
Calcium: 9 mg/dL (ref 8.9–10.3)
Chloride: 103 mmol/L (ref 98–111)
Creatinine, Ser: 0.9 mg/dL (ref 0.44–1.00)
GFR, Estimated: >60 mL/min (ref >60)
Glucose, Bld: 97 mg/dL (ref 70–99)
Potassium: 3.9 mmol/L (ref 3.5–5.1)
Sodium: 136 mmol/L (ref 135–145)
Total Bilirubin: 0.6 mg/dL (ref 0.3–1.2)
Total Protein: 8 g/dL (ref 6.5–8.1)

## 2022-05-26 LAB — RESP PANEL BY RT-PCR (RSV, FLU A&B, COVID)  RVPGX2
Influenza A by PCR: POSITIVE — AB
Influenza B by PCR: NEGATIVE
Resp Syncytial Virus by PCR: NEGATIVE
SARS Coronavirus 2 by RT PCR: NEGATIVE

## 2022-05-26 LAB — LIPASE, BLOOD: Lipase: 29 U/L (ref 11–51)

## 2022-05-26 LAB — I-STAT BETA HCG BLOOD, ED (MC, WL, AP ONLY): I-stat hCG, quantitative: <5 m[IU]/mL (ref <5)

## 2022-05-26 MED ORDER — IOHEXOL 300 MG/ML  SOLN
100.0000 mL | Freq: Once | INTRAMUSCULAR | Status: AC | PRN
  Administered 2022-05-26: 100 mL via INTRAVENOUS

## 2022-05-26 MED ORDER — KETOROLAC TROMETHAMINE 30 MG/ML IJ SOLN
30.0000 mg | Freq: Once | INTRAMUSCULAR | Status: DC
  Administered 2022-05-26: 30 mg via INTRAVENOUS
  Filled 2022-05-26: qty 1

## 2022-05-26 NOTE — Discharge Instructions (Signed)
You have been seen today for your complaint of influenza. Your lab work was positive for influenza A. Your imaging showed no abdominal abnormalities. Your discharge medications include Alternate tylenol and ibuprofen for fevers. You may alternate these every 4 hours. You may take up to 800 mg of ibuprofen at a time and up to 1000 mg of tylenol. . Home care instructions are as follows:  Drink plenty of fluids. Stay away from other people for the next 2 days Follow up with: your primary care provider in 7 days for reassessment Please seek immediate medical care if you develop any of the following symptoms: Develop shortness of breath or have difficulty breathing. Have skin or nails that turn a bluish color. Have severe pain or stiffness in your neck. Develop a sudden headache or sudden pain in your face or ear. Cannot eat or drink without vomiting. At this time there does not appear to be the presence of an emergent medical condition, however there is always the potential for conditions to change. Please read and follow the below instructions.  Do not take your medicine if  develop an itchy rash, swelling in your mouth or lips, or difficulty breathing; call 911 and seek immediate emergency medical attention if this occurs.  You may review your lab tests and imaging results in their entirety on your MyChart account.  Please discuss all results of fully with your primary care provider and other specialist at your follow-up visit.  Note: Portions of this text may have been transcribed using voice recognition software. Every effort was made to ensure accuracy; however, inadvertent computerized transcription errors may still be present.

## 2022-05-26 NOTE — ED Provider Notes (Signed)
[facility]-EMERGENCY DEPT Provider Note   CSN: [id number] Arrival date & time: 05/26/22  1108     History  Chief Complaint  Patient presents with   Generalized Body Aches    Sabrina Berry is a 31 y.o. female. With no significant past medical history who presents to the ED for evaluation of fevers, chills, body aches, cough. Symptoms began 3 days ago. She works in a [employer] and states she is exposed to sick people frequently. She woke up today with sharp and stabbing right lower quadrant pain. This has caused some nausea. Denies emesis. Has not been able to eat today. No history of abdominal surgeries. Denies smoking. Social drinking.   HPI     Home Medications Prior to Admission medications   Medication Sig Start Date End Date Taking? Authorizing Provider  acetaminophen (TYLENOL) 500 MG tablet Take 1 tablet (500 mg total) by mouth every 6 (six) hours as needed. Patient not taking: No sig reported 08/01/15   Cheri Fowler, PA-C  ibuprofen (ADVIL,MOTRIN) 600 MG tablet Take 1 tablet (600 mg total) by mouth every 6 (six) hours as needed. Patient not taking: No sig reported 03/24/18   McDonald, Mia A, PA-C  JUNEL 1.5/30 1.5-30 MG-MCG tablet Take 1 tablet by mouth daily. 07/01/20   [provider]  ondansetron (ZOFRAN ODT) 4 MG disintegrating tablet Take 1 tablet (4 mg total) by mouth every 8 (eight) hours as needed for nausea or vomiting. Patient not taking: No sig reported 02/08/21   Army Melia A, PA-C      Allergies    Penicillins and Doxycycline    Review of Systems   Review of Systems  Constitutional:  Positive for chills and fever.  Respiratory:  Positive for cough.   Gastrointestinal:  Positive for abdominal pain and nausea.    Physical Exam Updated Vital Signs BP 111/80   Pulse 88   Temp 98.5 F (36.9 C) (Oral)   Resp 16   SpO2 99%  Physical Exam Vitals and nursing note reviewed.  Constitutional:      General: She is not in  acute distress.    Appearance: She is well-developed. She is obese. She is not ill-appearing, toxic-appearing or diaphoretic.  HENT:     Head: Normocephalic and atraumatic.  Eyes:     Conjunctiva/sclera: Conjunctivae normal.  Cardiovascular:     Rate and Rhythm: Normal rate and regular rhythm.     Heart sounds: No murmur heard. Pulmonary:     Effort: Pulmonary effort is normal. No respiratory distress.     Breath sounds: Normal breath sounds. No stridor. No wheezing, rhonchi or rales.  Abdominal:     Palpations: Abdomen is soft.     Tenderness: There is abdominal tenderness (RLQ).  Musculoskeletal:        General: No swelling.     Cervical back: Neck supple.  Skin:    General: Skin is warm and dry.     Capillary Refill: Capillary refill takes less than 2 seconds.  Neurological:     General: No focal deficit present.     Mental Status: She is alert and oriented to person, place, and time.  Psychiatric:        Mood and Affect: Mood normal.     ED Results / Procedures / Treatments   Labs (all labs ordered are listed, but only abnormal results are displayed) Labs Reviewed  RESP PANEL BY RT-PCR (RSV, FLU A&B, COVID)  RVPGX2 - Abnormal; Notable for the  following components:      Result Value   Influenza A by PCR POSITIVE (*)    All other components within normal limits  CBC WITH DIFFERENTIAL/PLATELET - Abnormal; Notable for the following components:   Lymphs Abs 0.5 (*)    All other components within normal limits  URINALYSIS, ROUTINE W REFLEX MICROSCOPIC - Abnormal; Notable for the following components:   Specific Gravity, Urine 1.032 (*)    Ketones, ur 20 (*)    Protein, ur 30 (*)    All other components within normal limits  COMPREHENSIVE METABOLIC PANEL  LIPASE, BLOOD  I-STAT BETA HCG BLOOD, ED ([facility], [facility], [facility] ONLY)    EKG EKG Interpretation  Date/Time:  Wednesday May 26 2022 17:17:42 EST Ventricular Rate:  85 PR Interval:  130 QRS Duration: 70 QT  Interval:  347 QTC Calculation: 413 R Axis:   50 Text Interpretation: Sinus rhythm Consider right atrial enlargement No significant change since prior 8/23 Confirmed by Meridee Score [phone]) on 05/26/2022 5:19:01 PM  Radiology CT Abdomen Pelvis W Contrast  Result Date: 05/26/2022 CLINICAL DATA:  Right lower quadrant abdominal pain. EXAM: CT ABDOMEN AND PELVIS WITH CONTRAST TECHNIQUE: Multidetector CT imaging of the abdomen and pelvis was performed using the standard protocol following bolus administration of intravenous contrast. RADIATION DOSE REDUCTION: This exam was performed according to the departmental dose-optimization program which includes automated exposure control, adjustment of the mA and/or kV according to patient size and/or use of iterative reconstruction technique. CONTRAST:  OMNIPAQUE IOHEXOL 300 MG/ML  SOLN COMPARISON:  None Available. FINDINGS: Lower chest: No acute abnormality. Hepatobiliary: No focal liver abnormality is seen. No gallstones, gallbladder wall thickening, or biliary dilatation. Pancreas: Unremarkable. No pancreatic ductal dilatation or surrounding inflammatory changes. Spleen: Normal in size without focal abnormality. Adrenals/Urinary Tract: Adrenal glands are unremarkable. Kidneys are normal, without renal calculi, focal lesion, or hydronephrosis. Bladder is unremarkable. Stomach/Bowel: Stomach is within normal limits. Appendix appears normal. No evidence of bowel wall thickening, distention, or inflammatory changes. Vascular/Lymphatic: No significant vascular findings are present. No enlarged abdominal or pelvic lymph nodes. Reproductive: Uterus and bilateral adnexa are unremarkable. Other: No abdominal wall hernia or abnormality. No abdominopelvic ascites. Musculoskeletal: No acute or significant osseous findings. IMPRESSION: 1. No CT evidence of acute abdominal/pelvic process. 2. Normal appendix. No evidence of colitis or diverticulitis. 3. No evidence of  nephrolithiasis or hydronephrosis. Electronically Signed   By: Darliss Cheney M.D.   On: 05/26/2022 19:06    Procedures Procedures    Medications Ordered in ED Medications  iohexol (OMNIPAQUE) 300 MG/ML solution 100 mL (100 mLs Intravenous Contrast Given 05/26/22 1841)    ED Course/ Medical Decision Making/ A&P                           Medical Decision Making Amount and/or Complexity of Data Reviewed Labs: ordered. Radiology: ordered.  Risk Prescription drug management.  This patient presents to the ED for concern of flu like illness, this involves an extensive number of treatment options, and is a complaint that carries with it a high risk of complications and morbidity.  The differential diagnosis includes flu, COVID, RSV, other URI  The differential diagnosis for generalized abdominal pain includes, but is not limited to AAA, gastroenteritis, appendicitis, Bowel obstruction, Bowel perforation. Gastroparesis, DKA, Hernia, Inflammatory bowel disease, mesenteric ischemia, pancreatitis, peritonitis SBP, volvulus.   My initial workup includes respiratory panel, abdominal pain labs  Additional history obtained from: Nursing notes  from this visit.  I ordered, reviewed and interpreted labs which include: CBC, CMP, lipase, hCG, urinalysis, respiratory panel. Flu A positive, urine concentrated    I ordered imaging studies including CT abdomen pelvis I independently visualized and interpreted imaging which showed normal I agree with the radiologist interpretation  Initially febrile at 101.8, was reduced to 98.5 in the ED without intervention. Otherwise hemodynamically stable. 31 year old female presenting to the ED for evaluation of a flu like illness for the past 3 days along with RLQ abdominal pain. She tested positive for Flu A. CT abdomen negative and I suspect the abdominal pain to be a side effect of the flu. She otherwise has a reassuring physical exam. This is day 3 of her  symptoms. She will be provided a work note in order to quarantine. Was encouraged to monitor her temperature and take tylenol and ibuprofen as needed. Was encouraged to drink plenty of fluids. Was given return precautions. Stable at discharge.   At this time there does not appear to be any evidence of an acute emergency medical condition and the patient appears stable for discharge with appropriate outpatient follow up. Diagnosis was discussed with patient who verbalizes understanding of care plan and is agreeable to discharge. I have discussed return precautions with patient and significant other who verbalizes understanding. Patient encouraged to follow-up with their PCP within 1 week. All questions answered.  Note: Portions of this report may have been transcribed using voice recognition software. Every effort was made to ensure accuracy; however, inadvertent computerized transcription errors may still be present.          Final Clinical Impression(s) / ED Diagnoses Final diagnoses:  Influenza A  Generalized abdominal pain    Rx / DC Orders ED Discharge Orders     None         Michelle Piper, Cordelia Poche 05/26/22 1932    Terrilee Files, MD 05/27/22 1109

## 2022-05-26 NOTE — ED Provider Triage Note (Signed)
Emergency Medicine Provider Triage Evaluation Note  LISA-MARIE RUEGER , a 31 y.o. female  was evaluated in triage.  Pt complains of bodyaches and intermittent cough for the past 3 days.  Works in [employer] and is exposed to sick people frequently.  Woke up this morning with sharp right lower quadrant abdominal pain.  Also feeling subjective fevers and chills.  Has nausea without vomiting.  Has not been able to eat today.  Denies abdominal surgeries.  Review of Systems  Positive: As above Negative: As above  Physical Exam  BP 119/82 (BP Location: Left Arm)   Pulse 100   Temp (!) 101.8 F (38.8 C) (Oral)   Resp 18   SpO2 100%  Gen:   Awake, no distress   Resp:  Normal effort  MSK:   Moves extremities without difficulty  Other:  Right lower quadrant exquisitely tender to palpation  Medical Decision Making  Medically screening exam initiated at 12:28 PM.  Appropriate orders placed.  JEANY SEVILLE was informed that the remainder of the evaluation will be completed by another provider, this initial triage assessment does not replace that evaluation, and the importance of remaining in the ED until their evaluation is complete.     Michelle Piper, [place] 05/26/22 1229

## 2022-05-26 NOTE — ED Notes (Signed)
Needs labs and IV for CT

## 2022-05-26 NOTE — ED Notes (Signed)
Pt ambulated from ED with steady gait. Pt verbalized understanding to discharge instructions. Pt dressed for discharge. Pt has access to home.

## 2022-05-26 NOTE — ED Triage Notes (Addendum)
Patient said her entire body has been hurting for 3 days. First day was a dry cough and now she has a headache and is nauseous. Took excedrin migraine for her headache at 12am. Someone at her job has covid.

## 2022-11-16 ENCOUNTER — Encounter (HOSPITAL_COMMUNITY): Payer: Self-pay

## 2022-11-16 ENCOUNTER — Emergency Department (HOSPITAL_COMMUNITY)
Admission: EM | Admit: 2022-11-16 | Discharge: 2022-11-16 | Disposition: A | Discharge status: Home / Self Care | Payer: Self-pay | Attending: Emergency Medicine | Admitting: Emergency Medicine

## 2022-11-16 DIAGNOSIS — J029 Acute pharyngitis, unspecified: Secondary | ICD-10-CM | POA: Insufficient documentation

## 2022-11-16 LAB — GROUP A STREP BY PCR: Group A Strep by PCR: NOT DETECTED

## 2022-11-16 NOTE — ED Triage Notes (Signed)
Pt arrives today c/o sore throat since yesterday. Pt reports feeling "hot' at home. Pt is concerned for strep.

## 2022-11-16 NOTE — ED Notes (Signed)
Pt ambulated from ed with steady gait. Pt verbalized understanding of discharge instructions. Pt dressed for discharge. Pt has access to home.  

## 2022-11-16 NOTE — ED Provider Notes (Addendum)
EMERGENCY DEPARTMENT AT [facility] Provider Note   CSN: [id number] Arrival date & time: 11/16/22  4098     History  Chief Complaint  Patient presents with  . Sore Throat    Sabrina Berry is a 32 y.o. female who presents to the ED with concerns for sore throat onset yesterday. Works at a daycare with sick contacts. Has associated subjective fever, rhinorrhea, nasal congestion, cough, and painful swallowing. No meds tried PTA. Denies trouble swallowing, shortness of breath.   The history is provided by the patient. No language interpreter was used.       Home Medications Prior to Admission medications   Medication Sig Start Date End Date Taking? Authorizing Provider  acetaminophen (TYLENOL) 500 MG tablet Take 1 tablet (500 mg total) by mouth every 6 (six) hours as needed. Patient not taking: No sig reported 08/01/15   Cheri Fowler, PA-C  ibuprofen (ADVIL,MOTRIN) 600 MG tablet Take 1 tablet (600 mg total) by mouth every 6 (six) hours as needed. Patient not taking: No sig reported 03/24/18   McDonald, Mia A, PA-C  JUNEL 1.5/30 1.5-30 MG-MCG tablet Take 1 tablet by mouth daily. 07/01/20   [provider]  ondansetron (ZOFRAN ODT) 4 MG disintegrating tablet Take 1 tablet (4 mg total) by mouth every 8 (eight) hours as needed for nausea or vomiting. Patient not taking: No sig reported 02/08/21   Jeannie Fend, PA-C      Allergies    Penicillins and Doxycycline    Review of Systems   Review of Systems  All other systems reviewed and are negative.   Physical Exam Updated Vital Signs BP 114/77   Pulse 85   Temp 98.7 F (37.1 C) (Oral)   Resp 16   Ht 5\' 2"  (1.575 m)   Wt 83.9 kg   LMP 10/26/2022   SpO2 100%   BMI 33.84 kg/m  Physical Exam Vitals and nursing note reviewed.  Constitutional:      General: She is not in acute distress.    Appearance: Normal appearance.  HENT:     Mouth/Throat:     Mouth: Mucous membranes are moist.      Pharynx: Oropharynx is clear. Uvula midline. No pharyngeal swelling, posterior oropharyngeal erythema or uvula swelling.     Tonsils: No tonsillar exudate.     Comments: Uvula midline without swelling. No posterior pharyngeal erythema or tonsillar exudate noted. Patent airway. Pt able to speak in clear complete sentences. Tolerating oral secretions. Eyes:     General: No scleral icterus.    Extraocular Movements: Extraocular movements intact.  Cardiovascular:     Rate and Rhythm: Normal rate.  Pulmonary:     Effort: Pulmonary effort is normal. No respiratory distress.  Abdominal:     Palpations: Abdomen is soft. There is no mass.     Tenderness: There is no abdominal tenderness.  Musculoskeletal:        General: Normal range of motion.     Cervical back: Neck supple.  Skin:    General: Skin is warm and dry.     Findings: No rash.  Neurological:     Mental Status: She is alert.     Sensory: Sensation is intact.     Motor: Motor function is intact.  Psychiatric:        Behavior: Behavior normal.     ED Results / Procedures / Treatments   Labs (all labs ordered are listed, but only abnormal results are displayed)  Labs Reviewed  GROUP A STREP BY PCR    EKG None  Radiology No results found.  Procedures Procedures    Medications Ordered in ED Medications - No data to display  ED Course/ Medical Decision Making/ A&P Clinical Course as of 11/16/22 1234  Tue Nov 16, 2022  1000 Offered respiratory panel swab and tylenol/ibuprofen in the ED to which patient declines at this time.  [SB]  1215 Group A Strep by PCR: NOT DETECTED [SB]  1215 Re-evaluated and pt resting comfortably on stretcher. Discussed with patient swab findings. Discussed discharge treatment plan. Pt agreeable at this time. Pt appears safe for discharge. [SB]    Clinical Course User Index [SB] Harl Wiechmann A, PA-C                             Medical Decision Making Amount and/or Complexity of Data  Reviewed Labs:  Decision-making details documented in ED Course.   Pt presents with sore throat onset yesterday. Vital signs, pt afebrile. On exam, pt with Uvula midline without swelling. No posterior pharyngeal erythema or tonsillar exudate noted. Patent airway. Pt able to speak in clear complete sentences. Tolerating oral secretions. No acute cardiovascular, respiratory exam findings. Differential diagnosis includes viral pharyngitis, strep pharyngitis, COVID, Flu.    Labs:  I ordered, and personally interpreted labs.  The pertinent results include:   Negative strep swab  Disposition: Presentation suspicious for viral pharyngitis. Doubt concerns at this time for strep pharyngitis, COVID, or Flu. After consideration of the diagnostic results and the patients response to treatment, I feel that the patient would benefit from Discharge home. Work note provided. Supportive care measures and strict return precautions discussed with patient at bedside. Pt acknowledges and verbalizes understanding. Pt appears safe for discharge. Follow up as indicated in discharge paperwork.    This chart was dictated using voice recognition software, Dragon. Despite the best efforts of this provider to proofread and correct errors, errors may still occur which can change documentation meaning.   Final Clinical Impression(s) / ED Diagnoses Final diagnoses:  Viral pharyngitis    Rx / DC Orders ED Discharge Orders     None         Berdella Bacot A, PA-C 11/16/22 1233    Blu Mcglaun A, PA-C 11/16/22 1234    Tegeler, Canary Brim, MD 11/16/22 1352

## 2022-11-16 NOTE — Discharge Instructions (Addendum)
It was a pleasure taking care of you today!  Your strep swab was negative today. You may take over the counter 600 mg ibuprofen every 6 hours and alternate with 500 mg tylenol every 6 hours. You may use over the counter lozenges, cough drops, Vicks chloraseptic sore throat spray for your symptoms. Ensure to maintain fluid intake with water, tea with honey, broth, pedialyte, gatorade. Follow up with your primary care provider regarding todays ED visit. Return to the ED if you are experiencing increasing/worsening symptoms.

## 2023-02-14 ENCOUNTER — Emergency Department (HOSPITAL_COMMUNITY)
Admission: EM | Admit: 2023-02-14 | Discharge: 2023-02-15 | Disposition: A | Discharge status: Home / Self Care | Payer: BC Managed Care – PPO | Attending: Emergency Medicine | Admitting: Emergency Medicine

## 2023-02-14 ENCOUNTER — Encounter (HOSPITAL_COMMUNITY): Payer: Self-pay

## 2023-02-14 ENCOUNTER — Other Ambulatory Visit: Payer: Self-pay

## 2023-02-14 DIAGNOSIS — J069 Acute upper respiratory infection, unspecified: Secondary | ICD-10-CM | POA: Insufficient documentation

## 2023-02-14 DIAGNOSIS — R059 Cough, unspecified: Secondary | ICD-10-CM | POA: Diagnosis present

## 2023-02-14 DIAGNOSIS — Z1152 Encounter for screening for COVID-19: Secondary | ICD-10-CM | POA: Insufficient documentation

## 2023-02-14 LAB — SARS CORONAVIRUS 2 BY RT PCR: SARS Coronavirus 2 by RT PCR: NEGATIVE

## 2023-02-14 MED ORDER — ACETAMINOPHEN 325 MG PO TABS
650.0000 mg | ORAL_TABLET | Freq: Once | ORAL | Status: AC
  Administered 2023-02-14: 650 mg via ORAL
  Filled 2023-02-14: qty 2

## 2023-02-14 NOTE — ED Triage Notes (Signed)
Pt reports flu like s/s: headache, chest congestion, cough, stuffy nose since last night

## 2023-02-15 MED ORDER — IBUPROFEN 800 MG PO TABS
800.0000 mg | ORAL_TABLET | Freq: Once | ORAL | Status: AC
  Administered 2023-02-15: 800 mg via ORAL
  Filled 2023-02-15: qty 1

## 2023-02-15 NOTE — ED Provider Notes (Signed)
Sabrina Berry EMERGENCY DEPARTMENT AT [facility] Provider Note   CSN: [id number] Arrival date & time: 02/14/23  1758     History  Chief Complaint  Patient presents with   Headache   Cough    Sabrina Berry is a 32 y.o. female.  The history is provided by the patient and medical records.  Headache Associated symptoms: cough   Cough Associated symptoms: headaches    42 old female presenting to the ED with 24 hours of headache, congestion, stuffy nose, and fever.  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  States she has not had any known sick contacts but this is certainly possible.  She was given Tylenol in triage but still has quite a bit of headache.  Home Medications Prior to Admission medications   Medication Sig Start Date End Date Taking? Authorizing Provider  acetaminophen (TYLENOL) 500 MG tablet Take 1 tablet (500 mg total) by mouth every 6 (six) hours as needed. Patient not taking: No sig reported 08/01/15   Sabrina Fowler, PA-C  ibuprofen (ADVIL,MOTRIN) 600 MG tablet Take 1 tablet (600 mg total) by mouth every 6 (six) hours as needed. Patient not taking: No sig reported 03/24/18   Berry, Sabrina A, PA-C  JUNEL 1.5/30 1.5-30 MG-MCG tablet Take 1 tablet by mouth daily. 07/01/20   [provider]  ondansetron (ZOFRAN ODT) 4 MG disintegrating tablet Take 1 tablet (4 mg total) by mouth every 8 (eight) hours as needed for nausea or vomiting. Patient not taking: No sig reported 02/08/21   Sabrina Berry A, PA-C      Allergies    Penicillins and Doxycycline    Review of Systems   Review of Systems  Respiratory:  Positive for cough.   Neurological:  Positive for headaches.  All other systems reviewed and are negative.   Physical Exam Updated Vital Signs BP 100/87 (BP Location: Left Arm)   Pulse 89   Temp 98.3 F (36.8 C) (Oral)   Resp 18   Ht 5\' 2"  (1.575 m)   Wt 90 kg    LMP 01/13/2023 (Approximate)   SpO2 100%   BMI 36.29 kg/m   Physical Exam Vitals and nursing note reviewed.  Constitutional:      General: She is not in acute distress.    Appearance: She is well-developed. She is not diaphoretic.  HENT:     Head: Normocephalic and atraumatic.     Right Ear: External ear normal.     Left Ear: External ear normal.     Nose:     Comments: Sounds congested Eyes:     Extraocular Movements: EOM normal.     Conjunctiva/sclera: Conjunctivae normal.     Pupils: Pupils are equal, round, and reactive to light.  Neck:     Comments: No rigidity, no meningismus Cardiovascular:     Rate and Rhythm: Normal rate and regular rhythm.     Heart sounds: Normal heart sounds. No murmur heard. Pulmonary:     Effort: Pulmonary effort is normal. No respiratory distress.     Breath sounds: Normal breath sounds. No wheezing or rhonchi.  Abdominal:     General: Bowel sounds are normal.     Palpations: Abdomen is soft.     Tenderness: There is no abdominal tenderness. There is no guarding.  Musculoskeletal:        General: No edema. Normal range of  motion.     Cervical back: Full passive range of motion without pain, normal range of motion and neck supple. No rigidity.  Skin:    General: Skin is warm and dry.     Findings: No rash.  Neurological:     Mental Status: She is alert and oriented to person, place, and time.     Cranial Nerves: No cranial nerve deficit.     Sensory: No sensory deficit.     Motor: No tremor or seizure activity.     Comments: AAOx3, answering questions and following commands appropriately; equal strength UE and LE bilaterally; CN grossly intact; moves all extremities appropriately without ataxia; no focal neuro deficits or facial asymmetry appreciated  Psychiatric:        Mood and Affect: Mood and affect normal.        Behavior: Behavior normal.        Thought Content: Thought content normal.     ED Results / Procedures / Treatments    Labs (all labs ordered are listed, but only abnormal results are displayed) Labs Reviewed  SARS CORONAVIRUS 2 BY RT PCR    EKG None  Radiology No results found.  Procedures Procedures    Medications Ordered in ED Medications  acetaminophen (TYLENOL) tablet 650 mg (650 mg Oral Given 02/14/23 1817)  ibuprofen (ADVIL) tablet 800 mg (800 mg Oral Given 02/15/23 0132)    ED Course/ Medical Decision Making/ A&P                                 Medical Decision Making Risk OTC drugs. Prescription drug management.   32 year old female presenting to the ED with 24 hours of URI symptoms.  Symptoms include fever, headache, congestion, and bodyaches.  Does work at a local daycare and [employer] so sick contacts possible.  Febrile on arrival but improved after Tylenol in triage.  She is nontoxic in appearance.  She does have some nasal congestion but lungs are clear without any wheezes or rhonchi.  She does not have any coughing.  COVID screen obtained and is negative.  Suspect this is likely viral process.  Headache without any meningeal signs or neurologic deficits.  Stable for discharge with symptomatic care.  Return here for new concerns.  Final Clinical Impression(s) / ED Diagnoses Final diagnoses:  Viral URI with cough    Rx / DC Orders ED Discharge Orders     None         Garlon Hatchet, PA-C 02/15/23 2956    Shon Baton, MD 02/15/23 [phone]

## 2023-02-15 NOTE — Discharge Instructions (Signed)
Continue tylenol or motrin as needed for headache/fever. Make sure to rest and hydrate. Follow-up with your primary care doctor. Return here for new concerns.

## 2023-07-20 ENCOUNTER — Encounter (HOSPITAL_COMMUNITY): Payer: Self-pay

## 2023-07-20 ENCOUNTER — Ambulatory Visit (HOSPITAL_COMMUNITY)
Admission: EM | Admit: 2023-07-20 | Discharge: 2023-07-20 | Disposition: A | Discharge status: Home / Self Care | Payer: BC Managed Care – PPO | Attending: Emergency Medicine | Admitting: Emergency Medicine

## 2023-07-20 DIAGNOSIS — Z113 Encounter for screening for infections with a predominantly sexual mode of transmission: Secondary | ICD-10-CM | POA: Insufficient documentation

## 2023-07-20 DIAGNOSIS — R300 Dysuria: Secondary | ICD-10-CM | POA: Diagnosis present

## 2023-07-20 DIAGNOSIS — L292 Pruritus vulvae: Secondary | ICD-10-CM | POA: Insufficient documentation

## 2023-07-20 DIAGNOSIS — N760 Acute vaginitis: Secondary | ICD-10-CM | POA: Insufficient documentation

## 2023-07-20 LAB — POCT URINALYSIS DIP (MANUAL ENTRY)
Bilirubin, UA: NEGATIVE
Blood, UA: NEGATIVE
Glucose, UA: NEGATIVE mg/dL
Ketones, POC UA: NEGATIVE mg/dL
Leukocytes, UA: NEGATIVE
Nitrite, UA: NEGATIVE
Spec Grav, UA: >=1.03 — AB (ref 1.010–1.025)
Urobilinogen, UA: 1 U/dL
pH, UA: 5.5 (ref 5.0–8.0)

## 2023-07-20 LAB — HIV ANTIBODY (ROUTINE TESTING W REFLEX): HIV Screen 4th Generation wRfx: NONREACTIVE

## 2023-07-20 NOTE — ED Triage Notes (Signed)
Patient reports that she has been having some dysuria, intermittent mid and lower abdominal pain when she wakes in the morning and states her urine had a foul odor at the beginning, but not now.

## 2023-07-20 NOTE — ED Provider Notes (Signed)
[facility]    CSN: [id number] Arrival date & time: 07/20/23  1424      History   Chief Complaint Chief Complaint  Patient presents with   Abdominal Pain   Dysuria    HPI Sabrina Berry is a 33 y.o. female.   Patient presents to clinic for lower abdominal discomfort, dysuria, vaginal itching, vaginal odor and discharge.  Symptoms started on Sunday.  She noticed she was itching vaginally so she cleaned with a soap and internally did a boric acid suppository.  Afterwards she did not have any more vaginal itching or malodorous discharge.  She is sexually active, has been in a committed relationship for 4 years and has lower concerns for sexually transmitted infections.  Does have dysuria.  No hematuria.  No flank pain, nausea, vomiting or fevers.  The history is provided by the patient and medical records.  Abdominal Pain Dysuria   History reviewed. No pertinent past medical history.  There are no active problems to display for this patient.   History reviewed. No pertinent surgical history.  OB History   No obstetric history on file.      Home Medications    Prior to Admission medications   Medication Sig Start Date End Date Taking? Authorizing Provider  acetaminophen (TYLENOL) 500 MG tablet Take 1 tablet (500 mg total) by mouth every 6 (six) hours as needed. Patient not taking: No sig reported 08/01/15   Cheri Fowler, PA-C  ibuprofen (ADVIL,MOTRIN) 600 MG tablet Take 1 tablet (600 mg total) by mouth every 6 (six) hours as needed. Patient not taking: No sig reported 03/24/18   McDonald, Mia A, PA-C  JUNEL 1.5/30 1.5-30 MG-MCG tablet Take 1 tablet by mouth daily. 07/01/20   [provider]  ondansetron (ZOFRAN ODT) 4 MG disintegrating tablet Take 1 tablet (4 mg total) by mouth every 8 (eight) hours as needed for nausea or vomiting. Patient not taking: No sig reported 02/08/21   Jeannie Fend, PA-C    Family History Family History  Adopted: Yes     Social History Social History   Tobacco Use   Smoking status: Some Days    Types: Cigars   Smokeless tobacco: Never  Vaping Use   Vaping status: Never Used  Substance Use Topics   Alcohol use: Yes    Comment: occ   Drug use: No     Allergies   Penicillins and Doxycycline   Review of Systems Review of Systems  Per HPI   Physical Exam Triage Vital Signs ED Triage Vitals [07/20/23 1501]  Encounter Vitals Group     BP 108/72     Systolic BP Percentile      Diastolic BP Percentile      Pulse Rate 93     Resp 16     Temp 98.8 F (37.1 C)     Temp Source Oral     SpO2 97 %     Weight      Height      Head Circumference      Peak Flow      Pain Score 0     Pain Loc      Pain Education      Exclude from Growth Chart    No data found.  Updated Vital Signs BP 108/72 (BP Location: Left Arm)   Pulse 93   Temp 98.8 F (37.1 C) (Oral)   Resp 16   LMP 06/25/2023   SpO2 97%  Visual Acuity Right Eye Distance:   Left Eye Distance:   Bilateral Distance:    Right Eye Near:   Left Eye Near:    Bilateral Near:     Physical Exam Vitals and nursing note reviewed.  Constitutional:      Appearance: She is well-developed.  HENT:     Head: Normocephalic and atraumatic.  Cardiovascular:     Rate and Rhythm: Normal rate.  Pulmonary:     Effort: Pulmonary effort is normal. No respiratory distress.  Abdominal:     Tenderness: There is no right CVA tenderness or left CVA tenderness.  Neurological:     General: No focal deficit present.     Mental Status: She is alert.  Psychiatric:        Mood and Affect: Mood normal.      UC Treatments / Results  Labs (all labs ordered are listed, but only abnormal results are displayed) Labs Reviewed  POCT URINALYSIS DIP (MANUAL ENTRY) - Abnormal; Notable for the following components:      Result Value   Clarity, UA cloudy (*)    Spec Grav, UA >=1.030 (*)    Protein Ur, POC trace (*)    All other components  within normal limits  URINE CULTURE  RPR  HIV ANTIBODY (ROUTINE TESTING W REFLEX)  CERVICOVAGINAL ANCILLARY ONLY    EKG   Radiology No results found.  Procedures Procedures (including critical care time)  Medications Ordered in UC Medications - No data to display  Initial Impression / Assessment and Plan / UC Course  I have reviewed the triage vital signs and the nursing notes.  Pertinent labs & imaging results that were available during my care of the patient were reviewed by me and considered in my medical decision making (see chart for details).  Vitals and triage reviewed, patient is hemodynamically stable.  Negative for CVA tenderness.  Urinalysis without leukocytes, red blood cells or nitrates.  Will send for culture to ensure no UTI due to dysuria.  Low concern for pyelonephritis or PID without CVA tenderness, nausea, vomiting, tachycardia or fever.  No current abdominal pain.  Suspect vaginitis with vaginal discharge, itching and vaginal odor, may have been cured with a boric acid suppository.  Cytology swab obtained and staff will contact if results are abnormal.  Opted for HIV and syphilis screening as well.  Plan of care, follow-up care return precautions given, no questions at this time.     Final Clinical Impressions(s) / UC Diagnoses   Final diagnoses:  Dysuria  Acute vaginitis     Discharge Instructions      Your urine did not show evidence of a urinary tract infection. The vaginal swab will tell us if you have any vaginal infections. Labs were drawn today to screen for syphilis and HIV. Results will be available on MyChart and staff will contact you if abnormal for appropriate medications.  For any other vaginal itching you can use over-the-counter Monistat.  Please do not clean internally and use unscented soaps or lotions externally to help avoid any irritation.  Avoid intercourse until results have been received.  Return to clinic for any new or urgent  symptoms.     ED Prescriptions   None    PDMP not reviewed this encounter.   Kendrew Paci, [place], [place] 07/20/23 [phone]

## 2023-07-20 NOTE — Discharge Instructions (Addendum)
Your urine did not show evidence of a urinary tract infection. The vaginal swab will tell us if you have any vaginal infections. Labs were drawn today to screen for syphilis and HIV. Results will be available on MyChart and staff will contact you if abnormal for appropriate medications.  For any other vaginal itching you can use over-the-counter Monistat.  Please do not clean internally and use unscented soaps or lotions externally to help avoid any irritation.  Avoid intercourse until results have been received.  Return to clinic for any new or urgent symptoms.

## 2023-07-21 ENCOUNTER — Telehealth (HOSPITAL_COMMUNITY): Payer: Self-pay

## 2023-07-21 LAB — URINE CULTURE: Culture: <10000 — AB

## 2023-07-21 LAB — CERVICOVAGINAL ANCILLARY ONLY
Bacterial Vaginitis (gardnerella): POSITIVE — AB
Candida Glabrata: NEGATIVE
Candida Vaginitis: POSITIVE — AB
Chlamydia: NEGATIVE
Comment: NEGATIVE
Comment: NEGATIVE
Comment: NEGATIVE
Comment: NEGATIVE
Comment: NEGATIVE
Comment: NORMAL
Neisseria Gonorrhea: NEGATIVE
Trichomonas: NEGATIVE

## 2023-07-21 LAB — RPR: RPR Ser Ql: NONREACTIVE

## 2023-07-21 MED ORDER — METRONIDAZOLE 500 MG PO TABS: 500.0000 mg | ORAL_TABLET | Freq: Two times a day (BID) | ORAL | 0 refills | Status: AC

## 2023-07-21 MED ORDER — FLUCONAZOLE 150 MG PO TABS: 150.0000 mg | ORAL_TABLET | Freq: Once | ORAL | 0 refills | Status: AC

## 2023-07-21 NOTE — Telephone Encounter (Signed)
Per protocol, pt requires tx with metronidazole and Diflucan.  Rx sent to pharmacy on file.

## 2023-10-28 ENCOUNTER — Encounter (HOSPITAL_COMMUNITY): Payer: Self-pay

## 2023-10-28 ENCOUNTER — Emergency Department (HOSPITAL_COMMUNITY)
Admission: EM | Admit: 2023-10-28 | Discharge: 2023-10-28 | Disposition: A | Discharge status: Home / Self Care | Attending: Emergency Medicine | Admitting: Emergency Medicine

## 2023-10-28 ENCOUNTER — Other Ambulatory Visit: Payer: Self-pay

## 2023-10-28 ENCOUNTER — Emergency Department (HOSPITAL_COMMUNITY)

## 2023-10-28 DIAGNOSIS — M79641 Pain in right hand: Secondary | ICD-10-CM | POA: Diagnosis present

## 2023-10-28 MED ORDER — ACETAMINOPHEN 325 MG PO TABS
650.0000 mg | ORAL_TABLET | Freq: Once | ORAL | Status: AC
  Administered 2023-10-28: 650 mg via ORAL
  Filled 2023-10-28: qty 2

## 2023-10-28 NOTE — ED Provider Notes (Signed)
 [place] [facility] AT [facility] Provider Note   CSN: [id number] Arrival date & time: 10/28/23  1302     History  Chief Complaint  Patient presents with   Hand Pain    Sabrina Berry is a 33 y.o. female with no pertinent past medical history presented for pain in her second right MCP joint since last night. Patient does work at [employer] and uses her hands a lot. Patient states that she has not had any fevers and still move her hand around and denies any weakness or paresthesias but states she would like to have an x-ray of her hand.   Home Medications Prior to Admission medications   Medication Sig Start Date End Date Taking? Authorizing Provider  acetaminophen  (TYLENOL ) 500 MG tablet Take 1 tablet (500 mg total) by mouth every 6 (six) hours as needed. Patient not taking: No sig reported 08/01/15   Rose, Kayla, PA-C  ibuprofen  (ADVIL ,MOTRIN ) 600 MG tablet Take 1 tablet (600 mg total) by mouth every 6 (six) hours as needed. Patient not taking: No sig reported 03/24/18   McDonald, Mia A, PA-C  JUNEL 1.5/30 1.5-30 MG-MCG tablet Take 1 tablet by mouth daily. 07/01/20   [provider]  ondansetron  (ZOFRAN  ODT) 4 MG disintegrating tablet Take 1 tablet (4 mg total) by mouth every 8 (eight) hours as needed for nausea or vomiting. Patient not taking: No sig reported 02/08/21   Darlis Eisenmenger, PA-C      Allergies    Penicillins and Doxycycline    Review of Systems   Review of Systems  Physical Exam Updated Vital Signs BP 121/67 (BP Location: Left Arm)   Pulse (!) 108   Temp 99 F (37.2 C)   Resp 18   Ht 5\' 2"  (1.575 m)   Wt 87.1 kg   LMP 10/06/2023 (Approximate)   SpO2 100%   BMI 35.12 kg/m  Physical Exam Vitals reviewed.  Constitutional:      General: She is not in acute distress. Cardiovascular:     Rate and Rhythm: Normal rate.     Pulses: Normal pulses.  Musculoskeletal:     Comments: Right hand: 5 out of 5 bilateral grip pain, wrist  extension is flexion, tenderness to right second MCP joint without wounds or deformities noted or overlying skin color changes or warmth, no flexor tendon tenderness or dactylitis noted Pain not out of proportion Soft compartments  Skin:    General: Skin is warm and dry.     Capillary Refill: Capillary refill takes less than 2 seconds.  Neurological:     Mental Status: She is alert.     Comments: Sensation intact distally  Psychiatric:        Mood and Affect: Mood normal.     ED Results / Procedures / Treatments   Labs (all labs ordered are listed, but only abnormal results are displayed) Labs Reviewed - No data to display  EKG None  Radiology DG Hand Complete Right Result Date: 10/28/2023 CLINICAL DATA:  pain to second mcp. EXAM: RIGHT HAND - COMPLETE 3+ VIEW COMPARISON:  None Available. FINDINGS: No acute fracture or dislocation. No aggressive osseous lesion. No significant arthritis. No periarticular osteopenia or bony erosions. No radiopaque foreign bodies. Soft tissues are within normal limits. IMPRESSION: No acute osseous abnormality of the right hand. Electronically Signed   By: Beula Brunswick M.D.   On: 10/28/2023 13:42    Procedures Procedures    Medications Ordered in ED Medications  acetaminophen  (TYLENOL ) tablet 650 mg (650 mg Oral Given 10/28/23 1326)    ED Course/ Medical Decision Making/ A&P                                 Medical Decision Making Amount and/or Complexity of Data Reviewed Radiology: ordered.  Risk OTC drugs.   [name] 33 y.o. presented today for right hand pain. Working DDx that I considered at this time includes, but not limited to, overuse injury, contusion, strain/sprain, fracture, dislocation, neurovascular compromise, septic joint, ischemic limb, compartment syndrome.  R/o DDx: contusion, strain/sprain, fracture, dislocation, neurovascular compromise, septic joint, ischemic limb, compartment syndrome: These are considered  less likely due to history of present illness, physical exam, labs/imaging findings.  Review of prior external notes: None  Unique Tests and My Independent Interpretation:  Right hand x-ray: No acute pathology  Social Determinants of Health: none  Discussion with Independent Historian: None  Discussion of Management of Tests: None  Risk: Medium: prescription drug management  Risk Stratification Score: None Plan: On exam patient was no acute distress stable vitals.  Patient is neuro vas intact but does have some tenderness to the second MCP joint without concerning features.  X-ray was negative for any acute pathology and so at this time do believe this is an overuse injury.  Patient given Tylenol  which did help her symptoms.  Patient's physical exam and imaging are reassuring.  I spoke to the patient about RICE therapy including Tylenol  every 6 hours needed pain, ice 3-4 times daily for 15 minutes at a time, elevation of extremity, using a brace and to follow-up with their primary care provider.  Patient was given return precautions. Patient stable for discharge at this time.  Patient verbalized understanding of plan.  This chart was dictated using voice recognition software.  Despite best efforts to proofread,  errors can occur which can change the documentation meaning.        Final Clinical Impression(s) / ED Diagnoses Final diagnoses:  Right hand pain    Rx / DC Orders ED Discharge Orders     None         Elex Grimmer 10/28/23 1436    Trish Furl, MD 10/29/23 [phone]

## 2023-10-28 NOTE — ED Provider Triage Note (Signed)
 Emergency Medicine Provider Triage Evaluation Note  Sabrina Berry , a 33 y.o. female  was evaluated in triage.  Pt complains of pain in her second right MCP joint since last night.  Patient does work at [employer] and uses her hands a lot.  Patient states that she has not had any fevers and still move her hand around and denies any weakness or paresthesias but states she would like to have an x-ray of her hand.  Review of Systems  Positive:  Negative:   Physical Exam  BP 121/67 (BP Location: Left Arm)   Pulse (!) 108   Temp 99 F (37.2 C)   Resp 18   Ht 5\' 2"  (1.575 m)   Wt 87.1 kg   LMP 10/06/2023 (Approximate)   SpO2 100%   BMI 35.12 kg/m  Gen:   Awake, no distress   Resp:  Normal effort  MSK:   Moves extremities without difficulty  Other:  5 out of 5 bilateral grip pain, wrist extension is flexion, tenderness to right second MCP joint without wounds or deformities noted or overlying skin color changes or warmth  Medical Decision Making  Medically screening exam initiated at 1:17 PM.  Appropriate orders placed.  [name] was informed that the remainder of the evaluation will be completed by another provider, this initial triage assessment does not replace that evaluation, and the importance of remaining in the ED until their evaluation is complete.  Workup initiated, patient stable.   Denese Finn, PA-C 10/28/23 1318

## 2023-10-28 NOTE — Discharge Instructions (Signed)
 Please follow-up with your primary care provider in regards to recent ER visit. Today's physical exam and imaging was reassuring and you most likely have a benign process going on. You may rest, ice, compress, elevate your extremity and use Tylenol every 6 hours as needed for pain. If you begin to have decreased sensation, skin color changes, pain out of proportion/not controlled by over-the-counter medications, rigid compartments, or other worsening of symptoms please return to ER.

## 2023-10-28 NOTE — ED Triage Notes (Signed)
 Pt here for right index finger swelling that started yesterday. C/O stomach pain that started this morning. Denies trauma to finger. C/O nausea

## 2024-01-16 ENCOUNTER — Emergency Department (HOSPITAL_COMMUNITY)
Admission: EM | Admit: 2024-01-16 | Discharge: 2024-01-16 | Disposition: A | Discharge status: Home / Self Care | Attending: Emergency Medicine | Admitting: Emergency Medicine

## 2024-01-16 ENCOUNTER — Encounter (HOSPITAL_COMMUNITY): Payer: Self-pay | Admitting: Family Medicine

## 2024-01-16 ENCOUNTER — Other Ambulatory Visit: Payer: Self-pay

## 2024-01-16 DIAGNOSIS — M546 Pain in thoracic spine: Secondary | ICD-10-CM | POA: Diagnosis present

## 2024-01-16 DIAGNOSIS — R0789 Other chest pain: Secondary | ICD-10-CM | POA: Diagnosis not present

## 2024-01-16 MED ORDER — LIDOCAINE 5 % EX PTCH
1.0000 | MEDICATED_PATCH | CUTANEOUS | Status: DC
  Administered 2024-01-16 (×2): 1 via TRANSDERMAL
  Filled 2024-01-16: qty 1

## 2024-01-16 MED ORDER — KETOROLAC TROMETHAMINE 30 MG/ML IJ SOLN
15.0000 mg | Freq: Once | INTRAMUSCULAR | Status: AC
  Administered 2024-01-16 (×2): 15 mg via INTRAMUSCULAR
  Filled 2024-01-16: qty 1

## 2024-01-16 NOTE — ED Provider Notes (Signed)
 [place] [facility] AT [facility] Provider Note   CSN: [id number] Arrival date & time: 01/16/24  9072     Patient presents with: No chief complaint on file.   Sabrina Berry is a 33 y.o. female.   33 year old female with no significant past medical history presenting due to sharp intermittent right mid thoracic back pain with some radiation along her posterior and lateral ribs Onset Sunday morning around 2 AM.  Worse with movement and occasionally with deep breathing States that she went to work and her symptoms improved yesterday with use of Biofreeze cream However this morning she woke up with continued pain and also felt pain with breathing so she came to the ED Tried some ibuprofen  with minimal relief yesterday however the Biofreeze gel helped Aside from birth control she is not on any other medications.  Denies any history of blood clots or cardiac history. Denies recent injury, chest pain, nausea/vomiting, abdominal pain, leg swelling, bowel/bladder issues, fevers, numbness or weakness in her arms or legs         Prior to Admission medications   Medication Sig Start Date End Date Taking? Authorizing Provider  acetaminophen  (TYLENOL ) 500 MG tablet Take 1 tablet (500 mg total) by mouth every 6 (six) hours as needed. Patient not taking: No sig reported 08/01/15   Rose, Kayla, PA-C  ibuprofen  (ADVIL ,MOTRIN ) 600 MG tablet Take 1 tablet (600 mg total) by mouth every 6 (six) hours as needed. Patient not taking: No sig reported 03/24/18   McDonald, Mia A, PA-C  JUNEL 1.5/30 1.5-30 MG-MCG tablet Take 1 tablet by mouth daily. 07/01/20   [provider]  ondansetron  (ZOFRAN  ODT) 4 MG disintegrating tablet Take 1 tablet (4 mg total) by mouth every 8 (eight) hours as needed for nausea or vomiting. Patient not taking: No sig reported 02/08/21   Beverley Leita LABOR, PA-C    Allergies: Penicillins and Doxycycline    Review of Systems  Constitutional:   Negative for fever.  Respiratory:  Negative for cough.   Cardiovascular:  Negative for chest pain, palpitations and leg swelling.  Gastrointestinal:  Negative for abdominal pain, nausea and vomiting.  Musculoskeletal:  Positive for back pain. Negative for neck pain and neck stiffness.  Neurological:  Negative for weakness, light-headedness and numbness.    Updated Vital Signs BP 115/70   Pulse 94   Temp 97.6 F (36.4 C) (Oral)   Resp 16   Ht 5' 2 (1.575 m)   Wt 88.9 kg   LMP 12/28/2023 (Approximate)   SpO2 100%   BMI 35.85 kg/m   Physical Exam Constitutional:      General: She is not in acute distress. HENT:     Head: Normocephalic and atraumatic.  Cardiovascular:     Rate and Rhythm: Normal rate and regular rhythm.     Heart sounds: Normal heart sounds. No murmur heard. Pulmonary:     Effort: Pulmonary effort is normal. No respiratory distress.     Breath sounds: Normal breath sounds. No wheezing, rhonchi or rales.  Chest:     Chest wall: No tenderness.  Abdominal:     General: Abdomen is flat. There is no distension.     Palpations: Abdomen is soft.     Tenderness: There is no abdominal tenderness.  Musculoskeletal:        General: Tenderness present. No swelling or deformity.     Cervical back: Normal range of motion and neck supple. No tenderness.  Comments: Point tenderness to palpation in R thoracic paraspinal area especially along ribs into R lateral chest wall Nontender throughout R shoulder and arm, normal shoulder ROM  Skin:    General: Skin is warm and dry.  Neurological:     General: No focal deficit present.     Mental Status: She is alert. Mental status is at baseline.     (all labs ordered are listed, but only abnormal results are displayed) Labs Reviewed - No data to display  EKG: None  Radiology: No results found.   Procedures   Medications Ordered in the ED  lidocaine  (LIDODERM ) 5 % 1 patch (1 patch Transdermal Patch Applied  01/16/24 1025)  ketorolac  (TORADOL ) 30 MG/ML injection 15 mg (15 mg Intramuscular Given 01/16/24 1027)                                    Medical Decision Making 33 year old female without significant past medical history presenting due to right thoracic back and lateral chest wall pain Suspect most likely MSK etiology such as costochondritis given point tenderness and reproducibility on exam and association of pain with movement Afebrile and hemodynamically stable.  Do not feel that additional workup is necessary as I have very low suspicion for pulmonary etiology such as pneumothorax or pneumonia, or atypical ACS given her history and exam findings Pt is on OCPs but is HDS on room air without active shortness of breath or respiratory distress, no leg swelling or tenderness to suggest DVT, no hx blood clots - very low suspicion for PE and do not feel imaging is necessary  Symptomatic treatment: Provide Toradol  x 1 Lidocaine  patch over upper back  10:43 AM Pt reevaluated, symptoms improved after toradol  and with lidocaine  patch Patient feels comfortable with discharge States she is comfortable to continue using over-the-counter Aleve/ibuprofen  Can also use lidocaine  patches/cream and/or Voltaren gel if the area is reachable discussed return precautions   Risk Prescription drug management.        Final diagnoses:  Right-sided thoracic back pain, unspecified chronicity    ED Discharge Orders     None          Romelle Booty, MD 01/16/24 1044    Pamella Ozell LABOR, DO 01/23/24 1116

## 2024-01-16 NOTE — Discharge Instructions (Addendum)
 Your back pain seems to be related to your muscles and ribs. You received a medication to help with your pain in the ER.  I recommend using NSAIDs such as ibuprofen  (Advil ) or naproxen (Aleve) to help with your symptoms. You can use these regularly over the next 2-3 days and then as needed.  You can also use lidocaine  patches or cream over the area.   I recommend following up with a primary care doctor. Please seek medical attention if you develop worsening pain, chest pain, shortness of breath, nausea/vomiting, numbness or weakness in your arms/legs, or loss of control of your bowel or bladder.

## 2024-01-16 NOTE — ED Triage Notes (Signed)
 Pt comes in POV complaining of Right thoracic back pain radiating to the right rib area.  Pain is 9/10 beginning around 2 am on Sunday morning while getting ready for work.  Pt endorses occasional SOB that she associates with what she thinks is likely musculoskeletal pain.
